# Patient Record
Sex: Female | Born: 1955 | Race: White | Hispanic: No | Marital: Married | Smoking: Never smoker
Health system: Southern US, Community
[De-identification: ages and names within clinical notes are randomized; demographics above are authoritative.]

## PROBLEM LIST (undated history)

## (undated) DIAGNOSIS — F419 Anxiety disorder, unspecified: Secondary | ICD-10-CM

## (undated) DIAGNOSIS — I10 Essential (primary) hypertension: Secondary | ICD-10-CM

## (undated) DIAGNOSIS — M1990 Unspecified osteoarthritis, unspecified site: Secondary | ICD-10-CM

## (undated) DIAGNOSIS — H269 Unspecified cataract: Secondary | ICD-10-CM

## (undated) DIAGNOSIS — I341 Nonrheumatic mitral (valve) prolapse: Secondary | ICD-10-CM

## (undated) DIAGNOSIS — I519 Heart disease, unspecified: Secondary | ICD-10-CM

## (undated) DIAGNOSIS — G4730 Sleep apnea, unspecified: Secondary | ICD-10-CM

## (undated) DIAGNOSIS — T7840XA Allergy, unspecified, initial encounter: Secondary | ICD-10-CM

## (undated) DIAGNOSIS — R413 Other amnesia: Secondary | ICD-10-CM

## (undated) DIAGNOSIS — J45909 Unspecified asthma, uncomplicated: Secondary | ICD-10-CM

## (undated) HISTORY — PX: BREAST BIOPSY: SHX20

## (undated) HISTORY — PX: TONSILLECTOMY AND ADENOIDECTOMY: SHX28

## (undated) HISTORY — PX: COLONOSCOPY: SHX174

## (undated) HISTORY — DX: Anxiety disorder, unspecified: F41.9

## (undated) HISTORY — DX: Unspecified osteoarthritis, unspecified site: M19.90

## (undated) HISTORY — DX: Sleep apnea, unspecified: G47.30

## (undated) HISTORY — DX: Heart disease, unspecified: I51.9

## (undated) HISTORY — DX: Allergy, unspecified, initial encounter: T78.40XA

## (undated) HISTORY — DX: Unspecified cataract: H26.9

## (undated) HISTORY — DX: Other amnesia: R41.3

## (undated) HISTORY — DX: Unspecified asthma, uncomplicated: J45.909

## (undated) HISTORY — DX: Essential (primary) hypertension: I10

## (undated) HISTORY — PX: EYE SURGERY: SHX253

## (undated) HISTORY — PX: CATARACT EXTRACTION: SUR2

---

## 1999-11-04 HISTORY — PX: BREAST SURGERY: SHX581

## 2010-03-22 DIAGNOSIS — I10 Essential (primary) hypertension: Secondary | ICD-10-CM | POA: Insufficient documentation

## 2010-03-26 DIAGNOSIS — N951 Menopausal and female climacteric states: Secondary | ICD-10-CM | POA: Insufficient documentation

## 2014-07-24 ENCOUNTER — Encounter: Payer: Self-pay | Admitting: Emergency Medicine

## 2014-07-24 ENCOUNTER — Emergency Department
Admission: EM | Admit: 2014-07-24 | Discharge: 2014-07-24 | Disposition: A | Discharge status: Home / Self Care | Payer: 59 | Source: Home / Self Care | Attending: Emergency Medicine | Admitting: Emergency Medicine

## 2014-07-24 DIAGNOSIS — L247 Irritant contact dermatitis due to plants, except food: Secondary | ICD-10-CM

## 2014-07-24 DIAGNOSIS — L255 Unspecified contact dermatitis due to plants, except food: Secondary | ICD-10-CM

## 2014-07-24 HISTORY — DX: Essential (primary) hypertension: I10

## 2014-07-24 HISTORY — DX: Nonrheumatic mitral (valve) prolapse: I34.1

## 2014-07-24 MED ORDER — PREDNISONE 20 MG PO TABS: 20.0000 mg | ORAL_TABLET | Freq: Two times a day (BID) | ORAL | Status: DC

## 2014-07-24 MED ORDER — METHYLPREDNISOLONE ACETATE 80 MG/ML IJ SUSP
80.0000 mg | Freq: Once | INTRAMUSCULAR | Status: AC
  Administered 2014-07-24: 80 mg via INTRAMUSCULAR

## 2014-07-24 MED ORDER — HYDROXYZINE HCL 10 MG PO TABS: 10.0000 mg | ORAL_TABLET | Freq: Four times a day (QID) | ORAL | Status: DC | PRN

## 2014-07-24 NOTE — ED Provider Notes (Signed)
CSN: [id number]     Arrival date & time 07/24/14  1403 History   First MD Initiated Contact with Patient 07/24/14 1408     Chief Complaint  Patient presents with  . Rash   (Consider location/radiation/quality/duration/timing/severity/associated sxs/prior Treatment) HPI Here with husband. They are from [place]. They're visiting family in [place]. Exposed to poison ivy 4 days ago, now severe pruritic spreading rash on face neck trunk and extremities. No lip swelling or dysphasia or breathing problems. No chest pain or wheezing or shortness of breath  No fever or chills No abdominal pain or nausea or vomiting No syncope or lightheadedness or focal neurologic symptoms   Past Medical History  Diagnosis Date  . Hypertension   . MVP (mitral valve prolapse)    Past Surgical History  Procedure Laterality Date  . Cesarean section     Family History  Problem Relation Age of Onset  . Cancer Mother   . Hypertension Mother   . Hypertension Father    History  Substance Use Topics  . Smoking status: Never Smoker   . Smokeless tobacco: Not on file  . Alcohol Use: Yes   OB History   Grav Para Term Preterm Abortions TAB SAB Ect Mult Living                 Review of Systems  All other systems reviewed and are negative.   Allergies  Review of patient's allergies indicates no known allergies.  Home Medications   Prior to Admission medications   Medication Sig Start Date End Date Taking? Authorizing Provider  calcium carbonate (OS-CAL) 600 MG TABS tablet Take 600 mg by mouth 2 (two) times daily with a meal.   Yes Historical Provider, MD  cholecalciferol (VITAMIN D) 1000 UNITS tablet Take 1,200 Units by mouth daily.   Yes Historical Provider, MD  metoprolol succinate (TOPROL-XL) 25 MG 24 hr tablet Take 25 mg by mouth daily.   Yes Historical Provider, MD  valsartan (DIOVAN) 80 MG tablet Take 80 mg by mouth daily.   Yes Historical Provider, MD  venlafaxine (EFFEXOR) 75 MG tablet  Take 75 mg by mouth 2 (two) times daily.   Yes Historical Provider, MD  hydrOXYzine (ATARAX/VISTARIL) 10 MG tablet Take 1 tablet (10 mg total) by mouth every 6 (six) hours as needed for itching. Take 2 at bedtime, for itch. Caution: May cause drowsiness 07/24/14   Lajean Manes, MD  predniSONE (DELTASONE) 20 MG tablet Take 1 tablet (20 mg total) by mouth 2 (two) times daily with a meal. X 7 days 07/24/14   Lajean Manes, MD   BP 119/64  Pulse 76  Temp(Src) 97.4 F (36.3 C) (Oral)  Resp 16  Ht  (1.676 m)  Wt 224 lb (101.606 kg)  BMI 36.17 kg/m2  SpO2 96% Physical Exam  Nursing note and vitals reviewed. Constitutional: She is oriented to person, place, and time. She appears well-developed and well-nourished. No distress.  HENT:  Head: Normocephalic and atraumatic.  Eyes: Conjunctivae and EOM are normal. Pupils are equal, round, and reactive to light. No scleral icterus.  Neck: Normal range of motion.  Cardiovascular: Normal rate.   Pulmonary/Chest: Effort normal.  Abdominal: She exhibits no distension.  Musculoskeletal: Normal range of motion.  Neurological: She is alert and oriented to person, place, and time.  Skin: Skin is warm.  Psychiatric: She has a normal mood and affect.   Skin: Severe red, maculopapular eruption, few with vesicles, some in clusters and streaks, on  face, neck, trunk and extremities. Bilateral. ED Course  Procedures (including critical care time) Labs Review Labs Reviewed - No data to display  Imaging Review No results found.   MDM   1. Contact dermatitis and eczema due to plant     Severe poison ivy contact dermatitis of face, trunk, and upper extremities. Treatment options discussed, as well as risks, benefits, alternatives. Patient voiced understanding and agreement with the following plans: Depo-Medrol 80 mg IM Prednisone burst 20 mg by mouth twice a day Hydroxyzine for itch Other symptomatic care discussed Follow-up with your primary care  doctor in 5-7 days if not improving, or sooner if symptoms become worse. Precautions discussed. Red flags discussed. Questions invited and answered. Patient and husband voiced understanding and agreement.   Lajean Manes, MD 07/24/14 [phone]

## 2014-07-24 NOTE — ED Notes (Signed)
Reports spreading of rash x 4 days; face, arms and abdomen. Did do yard work last week prior to rash.

## 2015-11-21 ENCOUNTER — Ambulatory Visit (INDEPENDENT_AMBULATORY_CARE_PROVIDER_SITE_OTHER): Payer: 59 | Admitting: Osteopathic Medicine

## 2015-11-21 ENCOUNTER — Encounter: Payer: Self-pay | Admitting: Osteopathic Medicine

## 2015-11-21 VITALS — BP 128/73 | HR 73 | Ht 66.0 in | Wt 209.0 lb

## 2015-11-21 DIAGNOSIS — H6122 Impacted cerumen, left ear: Secondary | ICD-10-CM

## 2015-11-21 DIAGNOSIS — Z1239 Encounter for other screening for malignant neoplasm of breast: Secondary | ICD-10-CM

## 2015-11-21 DIAGNOSIS — R059 Cough, unspecified: Secondary | ICD-10-CM

## 2015-11-21 DIAGNOSIS — E785 Hyperlipidemia, unspecified: Secondary | ICD-10-CM | POA: Diagnosis not present

## 2015-11-21 DIAGNOSIS — Z1159 Encounter for screening for other viral diseases: Secondary | ICD-10-CM | POA: Diagnosis not present

## 2015-11-21 DIAGNOSIS — Z79899 Other long term (current) drug therapy: Secondary | ICD-10-CM | POA: Diagnosis not present

## 2015-11-21 DIAGNOSIS — Z780 Asymptomatic menopausal state: Secondary | ICD-10-CM | POA: Insufficient documentation

## 2015-11-21 DIAGNOSIS — I10 Essential (primary) hypertension: Secondary | ICD-10-CM

## 2015-11-21 DIAGNOSIS — R05 Cough: Secondary | ICD-10-CM

## 2015-11-21 HISTORY — DX: Essential (primary) hypertension: I10

## 2015-11-21 MED ORDER — BENZONATATE 200 MG PO CAPS: 200.0000 mg | ORAL_CAPSULE | Freq: Three times a day (TID) | ORAL | Status: DC | PRN

## 2015-11-21 MED ORDER — IPRATROPIUM-ALBUTEROL 0.5-2.5 (3) MG/3ML IN SOLN
3.0000 mL | Freq: Once | RESPIRATORY_TRACT | Status: AC
  Administered 2015-11-21: 3 mL via RESPIRATORY_TRACT

## 2015-11-21 NOTE — Progress Notes (Signed)
HPI: Pamela Newman is a 60 y.o. female who presents to [facility] today for chief complaint of:  Chief Complaint  Patient presents with  . Establish Care    CHEST COLD    Chest Cold/Cough . Location: chest  . Quality: cough/congestion  . Duration: 3 weeks . Context: had bit of a cold, getting better overall but trouble shaking this cough  "Due for labs and other things" including Pap, mammo, preventive care. Current on colonoscopy.     Past medical, social and family history reviewed: Past Medical History  Diagnosis Date  . Hypertension   . MVP (mitral valve prolapse)   . Heart disease     SMALL PROLAPSE   Past Surgical History  Procedure Laterality Date  . Cesarean section    . Breast surgery  2001    BREAST CYST   Social History  Substance Use Topics  . Smoking status: Never Smoker   . Smokeless tobacco: Not on file  . Alcohol Use: Yes   Family History  Problem Relation Age of Onset  . Cancer Mother     BREAST  . Hypertension Mother   . Hyperlipidemia Mother   . Hypertension Father   . Hyperlipidemia Father   . Stroke Father   . Cancer Sister     LUNG  . Cancer Maternal Grandfather     COLON    Current Outpatient Prescriptions  Medication Sig Dispense Refill  . calcium carbonate (OS-CAL) 600 MG TABS tablet Take 600 mg by mouth 2 (two) times daily with a meal.    . metoprolol succinate (TOPROL-XL) 25 MG 24 hr tablet Take 25 mg by mouth daily.    . valsartan (DIOVAN) 80 MG tablet Take 80 mg by mouth daily.    Marland Kitchen venlafaxine (EFFEXOR) 75 MG tablet Take 75 mg by mouth 2 (two) times daily.    . cholecalciferol (VITAMIN D) 1000 UNITS tablet Take 1,200 Units by mouth daily.    . hydrOXYzine (ATARAX/VISTARIL) 10 MG tablet Take 1 tablet (10 mg total) by mouth every 6 (six) hours as needed for itching. Take 2 at bedtime, for itch. Caution: May cause drowsiness 21 tablet 0  . predniSONE (DELTASONE) 20 MG tablet Take 1 tablet (20 mg  total) by mouth 2 (two) times daily with a meal. X 7 days (Patient not taking: Reported on 11/21/2015) 14 tablet 0   No current facility-administered medications for this visit.   No Known Allergies    Review of Systems: CONSTITUTIONAL:  No  fever, no chills, No  unintentional weight changes HEAD/EYES/EARS/NOSE/THROAT: No  headache, no vision change, no hearing change, No  sore throat, No  sinus pressure CARDIAC: No  chest pain, No  pressure, No palpitations, No  orthopnea RESPIRATORY: (+) cough, No  shortness of breath/wheeze GASTROINTESTINAL: No  nausea, No  vomiting, No  abdominal pain, No  blood in stool, No  diarrhea, No  constipation  MUSCULOSKELETAL: No  myalgia/arthralgia GENITOURINARY: No  incontinence, No  abnormal genital bleeding/discharge SKIN: No  rash/wounds/concerning lesions HEM/ONC: No  easy bruising/bleeding, No  abnormal lymph node ENDOCRINE: No polyuria/polydipsia/polyphagia, No  heat/cold intolerance  NEUROLOGIC: No  weakness, No  dizziness, No  slurred speech PSYCHIATRIC: No  concerns with depression, No  concerns with anxiety, No sleep problems  Exam:  BP 128/73 mmHg  Pulse 73  Ht  (1.676 m)  Wt 209 lb (94.802 kg)  BMI 33.75 kg/m2 Constitutional: VS see above. General Appearance: alert, well-developed, well-nourished,  NAD Eyes: Normal lids and conjunctive, non-icteric sclera Ears, Nose, Mouth, Throat: MMM, Normal external inspection ears/nares/mouth/lips/gums, TM normal R but obscured by cerumen on L. Pharynx no erythema, no exudate.  Neck: No masses, trachea midline. No thyroid enlargement/tenderness/mass appreciated. No lymphadenopathy Respiratory: Normal respiratory effort. no wheeze, no rhonchi, no rales Cardiovascular: S1/S2 normal, no murmur, no rub/gallop auscultated. RRR. No lower extremity edema. Gastrointestinal: Nontender, no masses. No hepatomegaly, no splenomegaly. No hernia appreciated. Bowel sounds normal. Rectal exam deferred.   Musculoskeletal: Gait normal. No clubbing/cyanosis of digits.  Neurological: No cranial nerve deficit on limited exam. Motor and sensation intact and symmetric Skin: warm, dry, intact. No rash/ulcer. No concerning nevi or subq nodules on limited exam.   Psychiatric: Normal judgment/insight. Normal mood and affect. Oriented x3.     ASSESSMENT/PLAN:  Essential hypertension - Plan: CBC with Differential/Platelet, COMPLETE METABOLIC PANEL WITH GFR, Lipid panel, TSH  Hyperlipidemia - Plan: Lipid panel  Need for hepatitis C screening test - Plan: Hepatitis C antibody  Medication management - Plan: CBC with Differential/Platelet, COMPLETE METABOLIC PANEL WITH GFR  Postmenopause - Plan: VITAMIN D 25 Hydroxy (Vit-D Deficiency, Fractures)  Screening for breast cancer - Plan: MS DIGITAL SCREENING BILATERAL  Cough - Plan: benzonatate (TESSALON) 200 MG capsule, ipratropium-albuterol (DUONEB) 0.5-2.5 (3) MG/3ML nebulizer solution 3 mL   Return AT YOUR CONVENIENCE FOR ANNUAL WELLNESS EXAM AND PAP.

## 2016-02-01 ENCOUNTER — Other Ambulatory Visit: Payer: Self-pay | Admitting: Osteopathic Medicine

## 2016-03-03 ENCOUNTER — Other Ambulatory Visit: Payer: Self-pay | Admitting: Osteopathic Medicine

## 2016-03-05 ENCOUNTER — Ambulatory Visit (INDEPENDENT_AMBULATORY_CARE_PROVIDER_SITE_OTHER): Payer: 59

## 2016-03-05 ENCOUNTER — Ambulatory Visit: Payer: 59

## 2016-03-05 DIAGNOSIS — Z1231 Encounter for screening mammogram for malignant neoplasm of breast: Secondary | ICD-10-CM

## 2016-03-05 DIAGNOSIS — Z1239 Encounter for other screening for malignant neoplasm of breast: Secondary | ICD-10-CM

## 2016-03-26 ENCOUNTER — Encounter: Payer: 59 | Admitting: Osteopathic Medicine

## 2016-03-29 ENCOUNTER — Other Ambulatory Visit: Payer: Self-pay | Admitting: Osteopathic Medicine

## 2016-04-16 ENCOUNTER — Encounter: Payer: 59 | Admitting: Osteopathic Medicine

## 2016-05-01 ENCOUNTER — Other Ambulatory Visit: Payer: Self-pay | Admitting: Osteopathic Medicine

## 2016-05-02 ENCOUNTER — Other Ambulatory Visit: Payer: Self-pay | Admitting: Osteopathic Medicine

## 2016-05-02 LAB — CBC WITH DIFFERENTIAL/PLATELET
BASOS ABS: 47 {cells}/uL (ref 0–200)
Basophils Relative: 1 %
EOS PCT: 2 %
Eosinophils Absolute: 94 cells/uL (ref 15–500)
HCT: 39.8 % (ref 35.0–45.0)
Hemoglobin: 13.3 g/dL (ref 11.7–15.5)
LYMPHS PCT: 28 %
Lymphs Abs: 1316 cells/uL (ref 850–3900)
MCH: 29.6 pg (ref 27.0–33.0)
MCHC: 33.4 g/dL (ref 32.0–36.0)
MCV: 88.4 fL (ref 80.0–100.0)
MONOS PCT: 7 %
MPV: 10.4 fL (ref 7.5–12.5)
Monocytes Absolute: 329 cells/uL (ref 200–950)
NEUTROS PCT: 62 %
Neutro Abs: 2914 cells/uL (ref 1500–7800)
PLATELETS: 186 10*3/uL (ref 140–400)
RBC: 4.5 MIL/uL (ref 3.80–5.10)
RDW: 14.9 % (ref 11.0–15.0)
WBC: 4.7 10*3/uL (ref 3.8–10.8)

## 2016-05-03 LAB — COMPLETE METABOLIC PANEL WITH GFR
ALK PHOS: 56 U/L (ref 33–130)
ALT: 27 U/L (ref 6–29)
AST: 23 U/L (ref 10–35)
Albumin: 4.2 g/dL (ref 3.6–5.1)
BUN: 18 mg/dL (ref 7–25)
CHLORIDE: 104 mmol/L (ref 98–110)
CO2: 25 mmol/L (ref 20–31)
Calcium: 9.2 mg/dL (ref 8.6–10.4)
Creat: 1.05 mg/dL — ABNORMAL HIGH (ref 0.50–0.99)
GFR, EST NON AFRICAN AMERICAN: 57 mL/min — AB (ref >=60)
GFR, Est African American: 66 mL/min (ref >=60)
GLUCOSE: 94 mg/dL (ref 65–99)
Potassium: 4.4 mmol/L (ref 3.5–5.3)
SODIUM: 139 mmol/L (ref 135–146)
Total Bilirubin: 0.6 mg/dL (ref 0.2–1.2)
Total Protein: 6.7 g/dL (ref 6.1–8.1)

## 2016-05-03 LAB — LIPID PANEL
CHOL/HDL RATIO: 5.1 ratio — AB (ref <=5.0)
Cholesterol: 190 mg/dL (ref 125–200)
HDL: 37 mg/dL — AB (ref >=46)
LDL Cholesterol: 118 mg/dL (ref <130)
Triglycerides: 177 mg/dL — ABNORMAL HIGH (ref <150)
VLDL: 35 mg/dL — AB (ref <30)

## 2016-05-03 LAB — HEPATITIS C ANTIBODY: HCV Ab: NEGATIVE

## 2016-05-03 LAB — TSH: TSH: 1.87 m[IU]/L

## 2016-05-03 LAB — VITAMIN D 25 HYDROXY (VIT D DEFICIENCY, FRACTURES): VIT D 25 HYDROXY: 25 ng/mL — AB (ref 30–100)

## 2016-05-07 ENCOUNTER — Encounter: Payer: 59 | Admitting: Osteopathic Medicine

## 2016-05-28 ENCOUNTER — Other Ambulatory Visit (HOSPITAL_COMMUNITY)
Admission: RE | Admit: 2016-05-28 | Discharge: 2016-05-28 | Disposition: A | Discharge status: Home / Self Care | Payer: 59 | Source: Ambulatory Visit | Attending: Osteopathic Medicine | Admitting: Osteopathic Medicine

## 2016-05-28 ENCOUNTER — Ambulatory Visit (INDEPENDENT_AMBULATORY_CARE_PROVIDER_SITE_OTHER): Payer: 59 | Admitting: Osteopathic Medicine

## 2016-05-28 ENCOUNTER — Encounter: Payer: Self-pay | Admitting: Osteopathic Medicine

## 2016-05-28 VITALS — BP 120/70 | HR 85 | Ht 66.0 in | Wt 217.0 lb

## 2016-05-28 DIAGNOSIS — Z0000 Encounter for general adult medical examination without abnormal findings: Secondary | ICD-10-CM

## 2016-05-28 DIAGNOSIS — J302 Other seasonal allergic rhinitis: Secondary | ICD-10-CM

## 2016-05-28 DIAGNOSIS — Z1151 Encounter for screening for human papillomavirus (HPV): Secondary | ICD-10-CM | POA: Insufficient documentation

## 2016-05-28 DIAGNOSIS — R748 Abnormal levels of other serum enzymes: Secondary | ICD-10-CM

## 2016-05-28 DIAGNOSIS — Z01419 Encounter for gynecological examination (general) (routine) without abnormal findings: Secondary | ICD-10-CM | POA: Insufficient documentation

## 2016-05-28 DIAGNOSIS — R7989 Other specified abnormal findings of blood chemistry: Secondary | ICD-10-CM | POA: Insufficient documentation

## 2016-05-28 NOTE — Progress Notes (Signed)
HPI: Pamela Newman is a 60 y.o. Not Hispanic or Latino female  who presents to [facility] today, 05/28/16,  for chief complaint of:  Chief Complaint  Patient presents with  . Annual Exam  . Gynecologic Exam     See below for preventive care review. Patient's only complaint today is persistent allergy issues, sinus as well as some skin irritation.  Past medical, surgical, social and family history reviewed: Past Medical History:  Diagnosis Date  . Essential hypertension 11/21/2015  . Heart disease    SMALL PROLAPSE  . Hypertension   . MVP (mitral valve prolapse)    Past Surgical History:  Procedure Laterality Date  . BREAST SURGERY  2001   BREAST CYST  . CESAREAN SECTION     Social History  Substance Use Topics  . Smoking status: Never Smoker  . Smokeless tobacco: Not on file  . Alcohol use Yes   Family History  Problem Relation Age of Onset  . Cancer Mother     BREAST  . Hypertension Mother   . Hyperlipidemia Mother   . Hypertension Father   . Hyperlipidemia Father   . Stroke Father   . Cancer Sister     LUNG  . Cancer Maternal Grandfather     COLON     Current medication list and allergy/intolerance information reviewed:   Current Outpatient Prescriptions  Medication Sig Dispense Refill  . calcium carbonate (OS-CAL) 600 MG TABS tablet Take 600 mg by mouth 2 (two) times daily with a meal.    . FLUARIX QUADRIVALENT 0.5 ML injection TO BE ADMINISTERED BY PHARMACIST FOR IMMUNIZATION  0  . metoprolol succinate (TOPROL-XL) 25 MG 24 hr tablet Take 25 mg by mouth daily.    . valsartan (DIOVAN) 80 MG tablet Take 1 tablet (80 mg total) by mouth daily. 30 tablet 0  . venlafaxine XR (EFFEXOR-XR) 75 MG 24 hr capsule Take 75 mg by mouth daily with breakfast.     No current facility-administered medications for this visit.    No Known Allergies    Review of Systems:  Constitutional:  No  fever, no chills, No recent illness, No  unintentional weight changes. No significant fatigue.   HEENT: No  headache, no vision change, no hearing change, No sore throat, (+) sinus pressure  Cardiac: No  chest pain  Respiratory:  No  shortness of breath.  Gastrointestinal: No  abdominal pain   Musculoskeletal: No new myalgia/arthralgia  Genitourinary: No  incontinence, No  abnormal genital bleeding, No abnormal genital discharge  Skin: (+) allergic Rash, No other wounds/concerning lesions  Neurologic: No  weakness, No  dizziness  Psychiatric: No  concerns with depression, No  concerns with anxiety, No sleep problems, No mood problems  Exam:  BP 120/70   Pulse 85   Ht  (1.676 m)   Wt 217 lb (98.4 kg)   BMI 35.02 kg/m   Constitutional: VS see above. General Appearance: alert, well-developed, well-nourished, NAD  Ears, Nose, Mouth, Throat: MMM, Normal external inspection ears/nares/mouth/lips/gums.   Respiratory: Normal respiratory effort.   Cardiovascular: S1/S2 normal, no murmur, no rub/gallop auscultated. RRR.  Gastrointestinal: Nontender, no masses. No hepatomegaly, no splenomegaly. No hernia appreciated. Bowel sounds normal. Rectal exam deferred.   Musculoskeletal: Gait normal. No clubbing/cyanosis of digits.   Neurological: No cranial nerve deficit on limited exam. Motor and sensation intact and symmetric  Skin: warm, dry, intact. (+) urticarial diffuse rash, no ulcer. No concerning nevi or subq nodules  on limited exam.    Psychiatric: Normal judgment/insight. Normal mood and affect. Oriented x3.  GYN: No lesions/ulcers to external genitalia, normal urethra, normal vaginal mucosa, physiologic discharge, cervix normal without lesions, uterus not enlarged or tender, adnexa no masses and nontender  BREAST: No rashes/skin changes, normal fibrous breast tissue, no masses or tenderness, normal nipple without discharge, normal axilla    ASSESSMENT/PLAN:   Patient is going to call previous doctor's  office to confirm immunizations and previous colonoscopy report. May need to update tetanus/shingles  Continue current medications, ASCVD risk less than 7.5% based on current risk factors. Only need to repeat BMP for kidney function monitoring  Annual physical exam - Plan: Cytology - PAP  Seasonal allergies  Elevated serum creatinine - Plan: BASIC METABOLIC PANEL WITH GFR    FEMALE PREVENTIVE CARE  ANNUAL SCREENING/COUNSELING Tobacco - noNever  Alcohol - social drinker Diet/Exercise - HEALTHY HABITS DISCUSSED TO DECREASE CV RISK Depression - PQH2 Negative Domestic violence concerns - no HTN SCREENING - SEE VITALS Vaccination status - SEE BELOW  SEXUAL HEALTH Sexually active in the past year - yes With - Yes with female. STI - The patient denies history of sexually transmitted disease. STI testing today? - no  INFECTIOUS DISEASE SCREENING HIV - all adults 15-65 - does not need GC/CT - sexually active - does not need HepC - DOB [birth year] - does not need TB - does not need  DISEASE SCREENING Lipid - does not need DM2 - does not need Osteoporosis - does not need  CANCER SCREENING Cervical - needs Breast - does not need Lung - does not need Colon - does not need - had 2 years ago - Fall 2015 (+) Polys, f/u 3 years   ADULT VACCINATION Influenza - was not indicated Td - was not indicated HPV - was not indicated Zoster - was not indicated Pneumonia - was not indicated  OTHER Fall - exercise and Vit D age 46+ - does not need Consider ASA - age 84-59 - does not need but could consdier    Visit summary with medication list and pertinent instructions was printed for patient to review. All questions at time of visit were answered - patient instructed to contact office with any additional concerns. ER/RTC precautions were reviewed with the patient. Follow-up plan: Return in about 6 months (around 11/28/2016) for BLOOD PRESSURE AND VENLAXAFINE FOLLOW-UP.

## 2016-05-28 NOTE — Patient Instructions (Addendum)
Please call your previous doctors office to confirm immunizations: Tetanus/Tdap and Shingles. Having old colonoscopy reports will also be helpful.   For allergies: try Allegra/Fexofenadine, can use with Nasonex or Flonase nasal spray, can use Allegra-D w/ decongestant. If this isn't helping your symptoms, please let me know!   To monitor kidney function: sometime in the next 1 - 2 months we will have you repeat the blood work. Ideally, should be 6+ weeks after the initial blood draw, which was 05/02/16, so anytime after 06/13/16 should be fine.     Let's plan to follow up in 6 months to keep an eye on your blood pressure - we can also talk more about coming off the Venlaxafine if you'd like.

## 2016-05-29 ENCOUNTER — Other Ambulatory Visit: Payer: Self-pay | Admitting: Osteopathic Medicine

## 2016-05-30 LAB — CYTOLOGY - PAP

## 2016-06-06 ENCOUNTER — Other Ambulatory Visit: Payer: Self-pay | Admitting: Osteopathic Medicine

## 2016-07-03 ENCOUNTER — Other Ambulatory Visit: Payer: Self-pay | Admitting: Osteopathic Medicine

## 2016-07-28 ENCOUNTER — Other Ambulatory Visit: Payer: Self-pay | Admitting: Osteopathic Medicine

## 2016-08-30 ENCOUNTER — Other Ambulatory Visit: Payer: Self-pay | Admitting: Osteopathic Medicine

## 2016-09-12 LAB — BASIC METABOLIC PANEL WITH GFR
BUN: 11 mg/dL (ref 7–25)
CALCIUM: 9.6 mg/dL (ref 8.6–10.4)
CO2: 27 mmol/L (ref 20–31)
CREATININE: 1.06 mg/dL — AB (ref 0.50–0.99)
Chloride: 102 mmol/L (ref 98–110)
GFR, EST AFRICAN AMERICAN: 65 mL/min (ref >=60)
GFR, EST NON AFRICAN AMERICAN: 56 mL/min — AB (ref >=60)
GLUCOSE: 108 mg/dL — AB (ref 65–99)
Potassium: 4.4 mmol/L (ref 3.5–5.3)
Sodium: 141 mmol/L (ref 135–146)

## 2016-09-27 ENCOUNTER — Other Ambulatory Visit: Payer: Self-pay | Admitting: Osteopathic Medicine

## 2016-10-29 ENCOUNTER — Other Ambulatory Visit: Payer: Self-pay | Admitting: Osteopathic Medicine

## 2016-12-01 ENCOUNTER — Other Ambulatory Visit: Payer: Self-pay | Admitting: Osteopathic Medicine

## 2016-12-25 ENCOUNTER — Encounter: Payer: Self-pay | Admitting: Osteopathic Medicine

## 2016-12-25 ENCOUNTER — Ambulatory Visit (INDEPENDENT_AMBULATORY_CARE_PROVIDER_SITE_OTHER): Payer: 59 | Admitting: Osteopathic Medicine

## 2016-12-25 VITALS — BP 127/63 | HR 79 | Temp 98.1°F | Ht 66.0 in | Wt 248.0 lb

## 2016-12-25 DIAGNOSIS — Z0189 Encounter for other specified special examinations: Secondary | ICD-10-CM | POA: Diagnosis not present

## 2016-12-25 DIAGNOSIS — G9331 Postviral fatigue syndrome: Secondary | ICD-10-CM

## 2016-12-25 DIAGNOSIS — G933 Postviral fatigue syndrome: Secondary | ICD-10-CM

## 2016-12-25 LAB — COMPLETE METABOLIC PANEL WITH GFR
ALT: 32 U/L — ABNORMAL HIGH (ref 6–29)
AST: 27 U/L (ref 10–35)
Albumin: 4.3 g/dL (ref 3.6–5.1)
Alkaline Phosphatase: 56 U/L (ref 33–130)
BILIRUBIN TOTAL: 0.3 mg/dL (ref 0.2–1.2)
BUN: 19 mg/dL (ref 7–25)
CO2: 24 mmol/L (ref 20–31)
CREATININE: 1.06 mg/dL — AB (ref 0.50–0.99)
Calcium: 9.3 mg/dL (ref 8.6–10.4)
Chloride: 108 mmol/L (ref 98–110)
GFR, EST AFRICAN AMERICAN: 65 mL/min (ref >=60)
GFR, EST NON AFRICAN AMERICAN: 56 mL/min — AB (ref >=60)
Glucose, Bld: 85 mg/dL (ref 65–99)
Potassium: 4.5 mmol/L (ref 3.5–5.3)
Sodium: 142 mmol/L (ref 135–146)
TOTAL PROTEIN: 6.9 g/dL (ref 6.1–8.1)

## 2016-12-25 LAB — CBC WITH DIFFERENTIAL/PLATELET
BASOS ABS: 48 {cells}/uL (ref 0–200)
BASOS PCT: 1 %
Eosinophils Absolute: 240 cells/uL (ref 15–500)
Eosinophils Relative: 5 %
HCT: 39.9 % (ref 35.0–45.0)
HEMOGLOBIN: 13.2 g/dL (ref 11.7–15.5)
Lymphocytes Relative: 28 %
Lymphs Abs: 1344 cells/uL (ref 850–3900)
MCH: 29.4 pg (ref 27.0–33.0)
MCHC: 33.1 g/dL (ref 32.0–36.0)
MCV: 88.9 fL (ref 80.0–100.0)
MONO ABS: 480 {cells}/uL (ref 200–950)
MPV: 10.7 fL (ref 7.5–12.5)
Monocytes Relative: 10 %
Neutro Abs: 2688 cells/uL (ref 1500–7800)
Neutrophils Relative %: 56 %
Platelets: 215 10*3/uL (ref 140–400)
RBC: 4.49 MIL/uL (ref 3.80–5.10)
RDW: 15.1 % — ABNORMAL HIGH (ref 11.0–15.0)
WBC: 4.8 10*3/uL (ref 3.8–10.8)

## 2016-12-25 MED ORDER — METOPROLOL SUCCINATE ER 25 MG PO TB24: 25.0000 mg | ORAL_TABLET | Freq: Every day | ORAL | 1 refills | Status: DC

## 2016-12-25 MED ORDER — VALSARTAN 80 MG PO TABS: 80.0000 mg | ORAL_TABLET | Freq: Every day | ORAL | 1 refills | Status: DC

## 2016-12-25 MED ORDER — VENLAFAXINE HCL ER 75 MG PO CP24: 75.0000 mg | ORAL_CAPSULE | Freq: Every day | ORAL | 1 refills | Status: DC

## 2016-12-25 NOTE — Progress Notes (Signed)
HPI: Pamela Newman is a 61 y.o. female who [facility] for chief complaint of:  Chief Complaint  Patient presents with  . Cough  . Medication Refill    Acute Illness: . Context/Quality: recent illness, on and off feeling fatigue and drained, some coughing, thinks family may have had flu . Duration: on and off 3 weeks    Past medical, social and family history reviewed. Immune compromising conditions or other risk factors: none  Current medications and allergies reviewed.     Review of Systems:  Constitutional: subjective fever/chills  HEENT: No  headache, No  sore throat, No  swollen glands  Cardiovascular: No chest pain  Respiratory:Yes  cough, No  shortness of breath  Gastrointestinal: No  nausea, No  vomiting,  No  diarrhea  Musculoskeletal:   Yes  myalgia/arthralgia  Skin/Integument:  No  rash   Detailed Exam:  BP 127/63   Pulse 79   Temp 98.1 F (36.7 C) (Oral)   Ht 5\' 6"  (1.676 m)   Wt 248 lb (112.5 kg)   BMI 40.03 kg/m   Constitutional:   VSS, see above.   General Appearance: alert, well-developed, well-nourished, NAD  Eyes:   Normal lids and conjunctive, non-icteric sclera  Ears, Nose, Mouth, Throat:   Normal external inspection ears/nares  Normal mouth/lips/gums, MMM  normal TM  posterior pharynx without erythema, without exudate  nasal mucosa normal  Skin:  Normal inspection, no rash or concerning lesions noted on limited exam  Neck:   No masses, trachea midline. normal lymph nodes  Respiratory:   Normal respiratory effort.   No  wheeze/rhonchi/rales  Cardiovascular:   S1/S2 normal, no murmur/rub/gallop auscultated. RRR.     ASSESSMENT/PLAN: Likely viral fatigue syndrome, pt reassured nothing serious  Postviral fatigue syndrome - Plan: CBC with Differential/Platelet, COMPLETE METABOLIC PANEL WITH GFR, VITAMIN D 25 Hydroxy (Vit-D Deficiency, Fractures)  Patient  request for diagnostic testing - Plan: POCT Influenza A/B   Visit summary was printed for the patient with medications and pertinent instructions for patient to review. ER/RTC precautions reviewed. All questions answered. Return in about 2 weeks (around 01/08/2017) for DISCUSS EFFEXOR, ETC, sooner if needed.   There are other unrelated non-urgent complaints, but due to the busy schedule and the amount of time I've already spent with her, time does not permit me to address these routine issues at today's visit. I've requested another appointment to review these additional issues: depression, HTN - refills sent

## 2016-12-26 LAB — VITAMIN D 25 HYDROXY (VIT D DEFICIENCY, FRACTURES): Vit D, 25-Hydroxy: 27 ng/mL — ABNORMAL LOW (ref 30–100)

## 2017-01-13 ENCOUNTER — Ambulatory Visit: Payer: 59 | Admitting: Osteopathic Medicine

## 2017-01-26 ENCOUNTER — Ambulatory Visit: Payer: 59 | Admitting: Osteopathic Medicine

## 2017-03-24 ENCOUNTER — Ambulatory Visit (INDEPENDENT_AMBULATORY_CARE_PROVIDER_SITE_OTHER): Payer: 59

## 2017-03-24 ENCOUNTER — Ambulatory Visit (INDEPENDENT_AMBULATORY_CARE_PROVIDER_SITE_OTHER): Payer: 59 | Admitting: Osteopathic Medicine

## 2017-03-24 VITALS — BP 120/70 | HR 100 | Temp 98.2°F | Wt 240.0 lb

## 2017-03-24 DIAGNOSIS — R05 Cough: Secondary | ICD-10-CM

## 2017-03-24 DIAGNOSIS — R059 Cough, unspecified: Secondary | ICD-10-CM

## 2017-03-24 MED ORDER — AZITHROMYCIN 250 MG PO TABS: ORAL_TABLET | ORAL | 0 refills | Status: DC

## 2017-03-24 MED ORDER — GUAIFENESIN-CODEINE 100-10 MG/5ML PO SOLN: 5.0000 mL | ORAL | 0 refills | Status: DC | PRN

## 2017-03-24 MED ORDER — PREDNISONE 20 MG PO TABS: 20.0000 mg | ORAL_TABLET | Freq: Two times a day (BID) | ORAL | 0 refills | Status: DC

## 2017-03-24 NOTE — Progress Notes (Signed)
HPI: Pamela Newman is a 61 y.o. female who [facility] for chief complaint of:  Chief Complaint  Patient presents with  . Cough    Acute Illness: . Quality: dry cough . Assoc signs/symptoms: sinus congested, Temperature elevated at home up to 99 . Duration: 7 or so days, started last week and was a bit better over the weekend but none seems to be getting worse   Past medical, social and family history reviewed.  Patient Active Problem List   Diagnosis Date Noted  . Elevated serum creatinine 05/28/2016  . Essential hypertension 11/21/2015  . Hyperlipidemia 11/21/2015  . Postmenopause 11/21/2015   Current medications and allergies reviewed.     Review of Systems:  Constitutional: No  fever/chills  HEENT: sinus headache, No  sore throat, No  swollen glands  Cardiovascular: No chest pain  Respiratory:Yes  cough, No  shortness of breath  Gastrointestinal: No  nausea, No  vomiting,  No  diarrhea  Musculoskeletal:   No  myalgia/arthralgia  Skin/Integument:  No  rash   Detailed Exam:  BP 120/70   Pulse 100   Temp 98.2 F (36.8 C) (Oral)   Wt 240 lb (108.9 kg)   BMI 38.74 kg/m   Constitutional:   VSS, see above.   General Appearance: alert, well-developed, well-nourished, NAD  Eyes:   Normal lids and conjunctive, non-icteric sclera  Ears, Nose, Mouth, Throat:   Normal external inspection ears/nares  Normal mouth/lips/gums, MMM  Skin:  Normal inspection, no rash or concerning lesions noted on limited exam  Neck:   No masses, trachea midline. normal lymph nodes  Respiratory:   Normal respiratory effort.   No  wheeze/rhonchi/rales  Cardiovascular:   S1/S2 normal, no murmur/rub/gallop auscultated. RRR.  CXR personally reviewed. Radiology over-read as below No results found.   ASSESSMENT/PLAN:  Cough - Likely bronchitis vs viral URI, no concerns on CXR on personal review  - Plan: DG  Chest 2 View, predniSONE (DELTASONE) 20 MG tablet, azithromycin (ZITHROMAX) 250 MG tablet, guaiFENesin-codeine 100-10 MG/5ML syrup     Visit summary was printed for the patient with medications and pertinent instructions for patient to review. ER/RTC precautions reviewed. All questions answered. Return for ANNUAL PHYSICAL 05/2017, sooner if needed.

## 2017-03-24 NOTE — Patient Instructions (Signed)
Over-the-Counter Medications & Home Remedies for Upper Respiratory Illness  Note: the following list assumes no pregnancy, normal liver & kidney function and no other drug interactions. Dr. Corrisa Gibby has highlighted medications which are safe for you to use, but these may not be appropriate for everyone. Always ask a pharmacist or qualified medical provider if you have any questions!   Aches/Pains, Fever, Headache Acetaminophen (Tylenol) 500 mg tablets - take max 2 tablets (1000 mg) every 6 hours (4 times per day)  Ibuprofen (Motrin) 200 mg tablets - take max 4 tablets (800 mg) every 6 hours*  Sinus Congestion Prescription Atrovent as directed Cromolyn Nasal Spray (NasalCrom) 1 spray each nostril 3-4 times per day, max 6 imes per day Nasal Saline if desired Oxymetolazone (Afrin, others) sparing use due to rebound congestion, NEVER use in kids Phenylephrine (Sudafed) 10 mg tablets every 4 hours (or the 12-hour formulation)* Diphenhydramine (Benadryl) 25 mg tablets - take max 2 tablets every 4 hours  Cough & Sore Throat Prescription cough pills or syrups as directed Dextromethorphan (Robitussin, others) - cough suppressant Guaifenesin (Robitussin, Mucinex, others) - expectorant (helps cough up mucus) (Dextromethorphan and Guaifenesin also come in a combination tablet) Lozenges w/ Benzocaine + Menthol (Cepacol) Honey - as much as you want! Teas which "coat the throat" - look for ingredients Elm Bark, Licorice Root, Marshmallow Root  Other Antibiotics if these are prescribed - take ALL, even if you're feeling better  Zinc Lozenges within 24 hours of symptoms onset - mixed evidence this shortens the duration of the common cold Don't waste your money on Vitamin C or Echinacea  *Caution in patients with high blood pressure    

## 2017-04-09 ENCOUNTER — Other Ambulatory Visit: Payer: Self-pay | Admitting: Osteopathic Medicine

## 2017-06-08 ENCOUNTER — Telehealth: Payer: Self-pay | Admitting: Osteopathic Medicine

## 2017-06-08 DIAGNOSIS — Z0000 Encounter for general adult medical examination without abnormal findings: Secondary | ICD-10-CM

## 2017-06-08 NOTE — Telephone Encounter (Signed)
Patient called and request to get a mammogram order for imaging department. Has a cpe scheduled for 06/25/17 and would like to get labs done the week before. Thanks

## 2017-06-09 NOTE — Telephone Encounter (Signed)
Orders are in

## 2017-06-12 ENCOUNTER — Telehealth: Payer: Self-pay

## 2017-06-12 MED ORDER — LOSARTAN POTASSIUM 100 MG PO TABS: 100.0000 mg | ORAL_TABLET | Freq: Every day | ORAL | 0 refills | Status: DC

## 2017-06-12 NOTE — Telephone Encounter (Signed)
Dr. Lyn HollingsheadAlexander patient: Patient called in reference to Valsartan being on recall she is requesting a new blood pressure  Medication sent to her pharmacy. Please advise Estelle JuneRhonda Cunningham,CMA

## 2017-06-12 NOTE — Telephone Encounter (Signed)
Okay, new prescription sent for losartan in place of the valsartan. Recommend that she follow-up for a nurse visit in about 2-3 weeks on the new medication just so that we can make sure that it's working well. These medicines are similar but they are not exactly the same.  Pamela Gasseratherine Blessing Ozga, MD

## 2017-06-12 NOTE — Telephone Encounter (Signed)
Patient has been informed she stated that she has a follow up appointment with Dr. Lyn HollingsheadAlexander the end of this month. Rhonda Cunningham,CMA

## 2017-06-25 ENCOUNTER — Encounter: Payer: 59 | Admitting: Osteopathic Medicine

## 2017-07-08 ENCOUNTER — Ambulatory Visit: Payer: 59

## 2017-07-08 LAB — COMPLETE METABOLIC PANEL WITH GFR
ALBUMIN: 4.5 g/dL (ref 3.6–5.1)
ALT: 18 U/L (ref 6–29)
AST: 19 U/L (ref 10–35)
Alkaline Phosphatase: 59 U/L (ref 33–130)
BUN: 15 mg/dL (ref 7–25)
CALCIUM: 9.5 mg/dL (ref 8.6–10.4)
CO2: 26 mmol/L (ref 20–32)
CREATININE: 0.96 mg/dL (ref 0.50–0.99)
Chloride: 102 mmol/L (ref 98–110)
GFR, EST AFRICAN AMERICAN: 73 mL/min (ref >=60)
GFR, Est Non African American: 63 mL/min (ref >=60)
Glucose, Bld: 91 mg/dL (ref 65–99)
Potassium: 4.6 mmol/L (ref 3.5–5.3)
Sodium: 138 mmol/L (ref 135–146)
Total Bilirubin: 0.3 mg/dL (ref 0.2–1.2)
Total Protein: 7 g/dL (ref 6.1–8.1)

## 2017-07-08 LAB — TSH: TSH: 1.68 mIU/L

## 2017-07-08 LAB — LIPID PANEL
CHOL/HDL RATIO: 4.4 ratio (ref <5.0)
Cholesterol: 179 mg/dL (ref <200)
HDL: 41 mg/dL — AB (ref >50)
LDL CALC: 112 mg/dL — AB (ref <100)
Triglycerides: 129 mg/dL (ref <150)
VLDL: 26 mg/dL (ref <30)

## 2017-07-08 LAB — CBC
HEMATOCRIT: 43.8 % (ref 35.0–45.0)
HEMOGLOBIN: 14.5 g/dL (ref 11.7–15.5)
MCH: 30 pg (ref 27.0–33.0)
MCHC: 33.1 g/dL (ref 32.0–36.0)
MCV: 90.7 fL (ref 80.0–100.0)
MPV: 10.5 fL (ref 7.5–12.5)
Platelets: 228 10*3/uL (ref 140–400)
RBC: 4.83 MIL/uL (ref 3.80–5.10)
RDW: 15.1 % — ABNORMAL HIGH (ref 11.0–15.0)
WBC: 4.7 10*3/uL (ref 3.8–10.8)

## 2017-07-08 LAB — VITAMIN D 25 HYDROXY (VIT D DEFICIENCY, FRACTURES): Vit D, 25-Hydroxy: 41 ng/mL (ref 30–100)

## 2017-07-09 ENCOUNTER — Ambulatory Visit (INDEPENDENT_AMBULATORY_CARE_PROVIDER_SITE_OTHER): Payer: 59 | Admitting: Osteopathic Medicine

## 2017-07-09 ENCOUNTER — Encounter: Payer: Self-pay | Admitting: Osteopathic Medicine

## 2017-07-09 VITALS — BP 110/62 | HR 87 | Ht 66.0 in | Wt 233.0 lb

## 2017-07-09 DIAGNOSIS — F329 Major depressive disorder, single episode, unspecified: Secondary | ICD-10-CM | POA: Diagnosis not present

## 2017-07-09 DIAGNOSIS — J302 Other seasonal allergic rhinitis: Secondary | ICD-10-CM | POA: Diagnosis not present

## 2017-07-09 DIAGNOSIS — Z7189 Other specified counseling: Secondary | ICD-10-CM | POA: Diagnosis not present

## 2017-07-09 DIAGNOSIS — F32A Depression, unspecified: Secondary | ICD-10-CM

## 2017-07-09 DIAGNOSIS — I10 Essential (primary) hypertension: Secondary | ICD-10-CM

## 2017-07-09 DIAGNOSIS — Z0000 Encounter for general adult medical examination without abnormal findings: Secondary | ICD-10-CM | POA: Diagnosis not present

## 2017-07-09 DIAGNOSIS — Z23 Encounter for immunization: Secondary | ICD-10-CM | POA: Diagnosis not present

## 2017-07-09 LAB — HIV ANTIBODY (ROUTINE TESTING W REFLEX): HIV 1&2 Ab, 4th Generation: NONREACTIVE

## 2017-07-09 MED ORDER — METOPROLOL SUCCINATE ER 25 MG PO TB24: 25.0000 mg | ORAL_TABLET | Freq: Every day | ORAL | 3 refills | Status: DC

## 2017-07-09 MED ORDER — LOSARTAN POTASSIUM 100 MG PO TABS: 100.0000 mg | ORAL_TABLET | Freq: Every day | ORAL | 3 refills | Status: DC

## 2017-07-09 MED ORDER — BUPROPION HCL ER (XL) 150 MG PO TB24: 150.0000 mg | ORAL_TABLET | Freq: Every day | ORAL | 1 refills | Status: DC

## 2017-07-09 MED ORDER — VENLAFAXINE HCL ER 75 MG PO CP24: 75.0000 mg | ORAL_CAPSULE | Freq: Every day | ORAL | 3 refills | Status: DC

## 2017-07-09 NOTE — Progress Notes (Signed)
HPI: Pamela Newman is a 61 y.o. Not Hispanic or Latino female  who presents to [facility] today, 07/09/17,  for chief complaint of:  Chief Complaint  Patient presents with  . Annual Exam    See below for preventive care review  HTN: stable on current medications   Depression: on Effexor but feeling a bit low lately, trying to lose weight. Would like to dicsuss alternatives.   Past medical, surgical, social and family history reviewed: Past Medical History:  Diagnosis Date  . Essential hypertension 11/21/2015  . Heart disease    SMALL PROLAPSE  . Hypertension   . MVP (mitral valve prolapse)    Past Surgical History:  Procedure Laterality Date  . BREAST SURGERY  2001   BREAST CYST  . CESAREAN SECTION     Social History  Substance Use Topics  . Smoking status: Never Smoker  . Smokeless tobacco: Never Used  . Alcohol use Yes   Family History  Problem Relation Age of Onset  . Cancer Mother        BREAST  . Hypertension Mother   . Hyperlipidemia Mother   . Hypertension Father   . Hyperlipidemia Father   . Stroke Father   . Cancer Sister        LUNG  . Cancer Maternal Grandfather        COLON     Current medication list and allergy/intolerance information reviewed:   Current Outpatient Prescriptions  Medication Sig Dispense Refill  . calcium carbonate (OS-CAL) 600 MG TABS tablet Take 600 mg by mouth 2 (two) times daily with a meal.    . losartan (COZAAR) 100 MG tablet Take 1 tablet (100 mg total) by mouth daily. 30 tablet 0  . metoprolol succinate (TOPROL-XL) 25 MG 24 hr tablet TAKE 1 TABLET(25 MG) BY MOUTH DAILY 90 tablet 0  . valsartan (DIOVAN) 80 MG tablet TAKE 1 TABLET(80 MG) BY MOUTH DAILY 90 tablet 0  . venlafaxine XR (EFFEXOR-XR) 75 MG 24 hr capsule TAKE 1 CAPSULE(75 MG) BY MOUTH DAILY WITH BREAKFAST 90 capsule 0   No current facility-administered medications for this visit.    No Known Allergies    Review of  Systems:  Constitutional:  No  fever, no chills, No recent illness, No unintentional weight changes. No significant fatigue.   HEENT: No  headache, no vision change, no hearing change, No sore throat, (+) sinus pressure  Cardiac: No  chest pain  Respiratory:  No  shortness of breath.  Gastrointestinal: No  abdominal pain   Musculoskeletal: No new myalgia/arthralgia  Genitourinary: No  incontinence, No  abnormal genital bleeding, No abnormal genital discharge  Skin: No Rash, No other wounds/concerning lesions  Neurologic: No  weakness, No  dizziness  Psychiatric: No  concerns with depression, No  concerns with anxiety, No sleep problems, No mood problems  Exam:  BP 110/62   Pulse 87   Ht  (1.676 m)   Wt 233 lb (105.7 kg)   BMI 37.61 kg/m   Constitutional: VS see above. General Appearance: alert, well-developed, well-nourished, NAD  Ears, Nose, Mouth, Throat: MMM, Normal external inspection ears/nares/mouth/lips/gums.   Respiratory: Normal respiratory effort.   Cardiovascular: S1/S2 normal, no murmur, no rub/gallop auscultated. RRR.  Gastrointestinal: Nontender, no masses. No hepatomegaly, no splenomegaly. No hernia appreciated. Bowel sounds normal. Rectal exam deferred.   Musculoskeletal: Gait normal. No clubbing/cyanosis of digits.   Neurological: No cranial nerve deficit on limited exam. Motor and sensation intact  and symmetric  Skin: warm, dry, intact.  Psychiatric: Normal judgment/insight. Normal mood and affect. Oriented x3.      ASSESSMENT/PLAN:   Continue current medications, ASCVD risk less than 7.5% based on current risk factors.    Annual physical exam  Essential hypertension - Plan: losartan (COZAAR) 100 MG tablet, metoprolol succinate (TOPROL-XL) 25 MG 24 hr tablet  Cardiac risk counseling - ASCVD risk based on current factors: 4.9% not in statin benefit group   Seasonal allergic rhinitis, unspecified trigger  Depression, unspecified  depression type - Plan: venlafaxine XR (EFFEXOR-XR) 75 MG 24 hr capsule, buPROPion (WELLBUTRIN XL) 150 MG 24 hr tablet  Need for shingles vaccine - Plan: Varicella-zoster vaccine IM (Shingrix)  Need for immunization against influenza - Plan: Flu Vaccine QUAD 36+ mos IM    FEMALE PREVENTIVE CARE  ANNUAL SCREENING/COUNSELING Tobacco - noNever  Alcohol - social drinker Diet/Exercise - HEALTHY HABITS DISCUSSED TO DECREASE CV RISK Depression - PQH2 Negative Domestic violence concerns - no HTN SCREENING - SEE VITALS Vaccination status - SEE BELOW  SEXUAL HEALTH Sexually active in the past year - yes With - Yes with female. STI - The patient denies history of sexually transmitted disease. STI testing today? - no  INFECTIOUS DISEASE SCREENING HIV - all adults 15-65 - does not need -previous negative GC/CT - sexually active - does not need HepC - DOB [birth year] - does not need - negative 2017 TB - does not need  DISEASE SCREENING Lipid - does not need DM2 - does not need Osteoporosis - does not need  CANCER SCREENING Cervical - done 05/2016 age 61 and normal  Breast - option now to repeat annual - has appt  Lung - does not need Colon - had 2 years ago - Fall 2015 (+) Polyps, f/u 3 years per patient - she is advised to arrange follow-up and have them send us records    ADULT VACCINATION Influenza - encouraged annual vaccine Td - was not indicated, has had 11/18/2012 HPV - was not indicated Zoster - advised update to Shingrix if desired Pneumonia - was not indicated  Immunization History  Administered Date(s) Administered  . Influenza-Unspecified 10/11/2015  . Tdap 11/18/2012  . Zoster 05/23/2014   OTHER Fall - exercise and Vit D age 63+ - does not need Consider ASA - age 63-59 - does not need but could consdier    Visit summary with medication list and pertinent instructions was printed for patient to review. All questions at time of visit were answered - patient  instructed to contact office with any additional concerns. ER/RTC precautions were reviewed with the patient. Follow-up plan: Return in about 4 weeks (around 08/06/2017) for recheck mood on new medication - sooner if needed.

## 2017-07-29 ENCOUNTER — Ambulatory Visit: Payer: 59

## 2017-08-13 ENCOUNTER — Ambulatory Visit: Payer: 59 | Admitting: Osteopathic Medicine

## 2017-08-17 ENCOUNTER — Ambulatory Visit: Payer: Self-pay | Admitting: Osteopathic Medicine

## 2017-08-26 ENCOUNTER — Ambulatory Visit (INDEPENDENT_AMBULATORY_CARE_PROVIDER_SITE_OTHER): Payer: 59

## 2017-08-26 ENCOUNTER — Encounter: Payer: Self-pay | Admitting: Osteopathic Medicine

## 2017-08-26 ENCOUNTER — Ambulatory Visit (INDEPENDENT_AMBULATORY_CARE_PROVIDER_SITE_OTHER): Payer: 59 | Admitting: Osteopathic Medicine

## 2017-08-26 VITALS — BP 112/74 | HR 96 | Ht 66.0 in | Wt 238.0 lb

## 2017-08-26 DIAGNOSIS — F32A Depression, unspecified: Secondary | ICD-10-CM

## 2017-08-26 DIAGNOSIS — Z1231 Encounter for screening mammogram for malignant neoplasm of breast: Secondary | ICD-10-CM

## 2017-08-26 DIAGNOSIS — F419 Anxiety disorder, unspecified: Secondary | ICD-10-CM | POA: Insufficient documentation

## 2017-08-26 DIAGNOSIS — F329 Major depressive disorder, single episode, unspecified: Secondary | ICD-10-CM

## 2017-08-26 NOTE — Patient Instructions (Signed)
Plan:  Continue current meds  If you'd like to increase the Wellbutrin or decrease/stop the Effexor, call/message me!

## 2017-08-26 NOTE — Progress Notes (Signed)
HPI: Pamela Newman is a 61 y.o. female  who presents to [facility] today, 08/26/17,  for chief complaint of:  Chief Complaint  Patient presents with  . Follow-up    mood    At last visit, we decided to augment Effexor with Wellbutrin.  She started the Wellbutrin a few weeks later, has been about 4 weeks total and she is starting to feel a little bit of effect from this medicine.  Noticing that moods are better, motivation has improved.  No major side effects, was feeling a little drowsy on this and then started taking in the evenings, a bit too soon to tell if this is making much of a difference   Past medical history, surgical history, social history and family history reviewed.  Patient Active Problem List   Diagnosis Date Noted  . Elevated serum creatinine 05/28/2016  . Essential hypertension 11/21/2015  . Hyperlipidemia 11/21/2015  . Postmenopause 11/21/2015    Current medication list and allergy/intolerance information reviewed.   Current Outpatient Prescriptions on File Prior to Visit  Medication Sig Dispense Refill  . buPROPion (WELLBUTRIN XL) 150 MG 24 hr tablet Take 1 tablet (150 mg total) by mouth daily. 30 tablet 1  . calcium carbonate (OS-CAL) 600 MG TABS tablet Take 600 mg by mouth 2 (two) times daily with a meal.    . losartan (COZAAR) 100 MG tablet Take 1 tablet (100 mg total) by mouth daily. 90 tablet 3  . metoprolol succinate (TOPROL-XL) 25 MG 24 hr tablet Take 1 tablet (25 mg total) by mouth daily. 90 tablet 3  . venlafaxine XR (EFFEXOR-XR) 75 MG 24 hr capsule Take 1 capsule (75 mg total) by mouth daily with breakfast. 90 capsule 3   No current facility-administered medications on file prior to visit.    No Known Allergies    Review of Systems:  Constitutional: No recent illness  Cardiac: No  chest pain  Respiratory:  No  shortness of breath  Psychiatric: No  concerns with depression, No  concerns with anxiety  Exam:   BP 112/74   Pulse 96   Ht 5\' 6"  (1.676 m)   Wt 238 lb (108 kg)   BMI 38.41 kg/m   Constitutional: VS see above. General Appearance: alert, well-developed, well-nourished, NAD  Eyes: Normal lids and conjunctive, non-icteric sclera  Ears, Nose, Mouth, Throat: MMM, Normal external inspection ears/nares/mouth/lips/gums.  Neck: No masses, trachea midline.   Respiratory: Normal respiratory effort.   Musculoskeletal: Gait normal. Symmetric and independent movement of all extremities  Neurological: Normal balance/coordination. No tremor.  Skin: warm, dry, intact.   Psychiatric: Normal judgment/insight. Normal mood and affect. Oriented x3.       ASSESSMENT/PLAN: The encounter diagnosis was Depression, unspecified depression type.    Patient Instructions  Plan:  Continue current meds  If you'd like to increase the Wellbutrin or decrease/stop the Effexor, call/message me!     Follow-up plan: [facility], ok to refill meds until then (07/2018) .  Visit summary with medication list and pertinent instructions was printed for patient to review, alert us if any changes needed. All questions at time of visit were answered - patient instructed to contact office with any additional concerns. ER/RTC precautions were reviewed with the patient and understanding verbalized.   Note: Total time spent 15 minutes, greater than 50% of the visit was spent face-to-face counseling and coordinating care for the following: The primary encounter diagnosis was Depression, unspecified depression  type. A diagnosis of Seasonal allergic rhinitis, unspecified trigger was also pertinent to this visit.Marland Kitchen

## 2017-09-19 ENCOUNTER — Other Ambulatory Visit: Payer: Self-pay | Admitting: Osteopathic Medicine

## 2017-09-19 DIAGNOSIS — F329 Major depressive disorder, single episode, unspecified: Secondary | ICD-10-CM

## 2017-09-19 DIAGNOSIS — F32A Depression, unspecified: Secondary | ICD-10-CM

## 2017-10-01 ENCOUNTER — Encounter: Payer: Self-pay | Admitting: Osteopathic Medicine

## 2017-10-01 ENCOUNTER — Ambulatory Visit (INDEPENDENT_AMBULATORY_CARE_PROVIDER_SITE_OTHER): Payer: 59 | Admitting: Osteopathic Medicine

## 2017-10-01 ENCOUNTER — Other Ambulatory Visit: Payer: Self-pay | Admitting: Osteopathic Medicine

## 2017-10-01 VITALS — BP 107/72 | HR 104 | Temp 99.5°F | Resp 18 | Wt 238.3 lb

## 2017-10-01 DIAGNOSIS — R509 Fever, unspecified: Secondary | ICD-10-CM | POA: Insufficient documentation

## 2017-10-01 DIAGNOSIS — R1032 Left lower quadrant pain: Secondary | ICD-10-CM | POA: Diagnosis not present

## 2017-10-01 DIAGNOSIS — R000 Tachycardia, unspecified: Secondary | ICD-10-CM | POA: Diagnosis not present

## 2017-10-01 DIAGNOSIS — R5383 Other fatigue: Secondary | ICD-10-CM

## 2017-10-01 DIAGNOSIS — R0602 Shortness of breath: Secondary | ICD-10-CM | POA: Diagnosis not present

## 2017-10-01 LAB — D-DIMER, QUANTITATIVE: D-Dimer, Quant: 0.91 mcg/mL FEU — ABNORMAL HIGH (ref <0.50)

## 2017-10-01 NOTE — Addendum Note (Signed)
Addended by: Deirdre PippinsALEXANDER, Herbert Aguinaldo M on: 10/01/2017 02:51 PM   Modules accepted: Orders

## 2017-10-01 NOTE — Progress Notes (Addendum)
HPI: Pamela Newman is a 61 y.o. female who  has a past medical history of Essential hypertension (11/21/2015), Heart disease, Hypertension, and MVP (mitral valve prolapse).  she presents to [facility] today, 10/01/17,  for chief complaint of:  Chief Complaint  Patient presents with  . Hypertension    Elevated pulse rate  . Cough    achy;feverx 3 wks  . Abdominal Pain    LLQ on/off x 1 week     Vague symptoms of fatigue/illness and low-grade fever. Started 3 weeks ago as fatigue/achiness, no respiratory symptoms, no diarrhea. She had a flu shot earlier this year.   Concerned about high HR. Fit-Bit device noted high pulse rate into 110's which is unusual for her. She does not feel any palpitations.   Temp up to 99.5 now, thinks a bit higher at home but no measurements.   C/o LLQ abd pain persistent past week or so w/ loose stools few times a day, nonbloody. Started as constipation then she self-treated with OTC meds and diarrhea started, got a bit better but stools are not quite back to normal. Pain is improving but not resolved. No known hx diverticular disease.   Fatigue, just feeling wiped out. Notices some mild SOB on exertion but no CP, lightheadedness, coughing.      Past medical, surgical, social and family history reviewed:  Patient Active Problem List   Diagnosis Date Noted  . Depression 08/26/2017  . Elevated serum creatinine 05/28/2016  . Essential hypertension 11/21/2015  . Hyperlipidemia 11/21/2015  . Postmenopause 11/21/2015    Past Surgical History:  Procedure Laterality Date  . BREAST BIOPSY Left    benign   . BREAST SURGERY  2001   BREAST CYST  . CESAREAN SECTION      Social History   Tobacco Use  . Smoking status: Never Smoker  . Smokeless tobacco: Never Used  Substance Use Topics  . Alcohol use: Yes    Family History  Problem Relation Age of Onset  . Cancer Mother        BREAST  . Hypertension Mother   .  Hyperlipidemia Mother   . Hypertension Father   . Hyperlipidemia Father   . Stroke Father   . Cancer Sister        LUNG  . Cancer Maternal Grandfather        COLON     Current medication list and allergy/intolerance information reviewed:    Current Outpatient Medications  Medication Sig Dispense Refill  . buPROPion (WELLBUTRIN XL) 150 MG 24 hr tablet TAKE 1 TABLET BY MOUTH EVERY DAY 30 tablet 1  . calcium carbonate (OS-CAL) 600 MG TABS tablet Take 600 mg by mouth 2 (two) times daily with a meal.    . cetirizine (ZYRTEC) 10 MG tablet Take 10 mg by mouth daily.    Marland Kitchen losartan (COZAAR) 100 MG tablet Take 1 tablet (100 mg total) by mouth daily. 90 tablet 3  . metoprolol succinate (TOPROL-XL) 25 MG 24 hr tablet Take 1 tablet (25 mg total) by mouth daily. 90 tablet 3  . Multiple Vitamin (MULTIVITAMIN) tablet Take 1 tablet by mouth daily.    Marland Kitchen venlafaxine XR (EFFEXOR-XR) 75 MG 24 hr capsule Take 1 capsule (75 mg total) by mouth daily with breakfast. 90 capsule 3   No current facility-administered medications for this visit.     No Known Allergies    Review of Systems:  Constitutional:  +fever, +chills, +recent illness, No unintentional  weight changes. +significant fatigue.   HEENT: No  headache, no vision change, no hearing change, No sore throat, No  sinus pressure  Cardiac: No  chest pain, No  pressure, No palpitations, No  Orthopnea  Respiratory:  +shortness of breath. No  Cough  Gastrointestinal: +abdominal pain, No  nausea, No  vomiting,  No  blood in stool, +diarrhea, No  constipation   Musculoskeletal: No new myalgia/arthralgia  Genitourinary: No  incontinence, No  abnormal genital bleeding, No abnormal genital discharge  Skin: No  Rash  Hem/Onc: No  easy bruising/bleeding, No  abnormal lymph node  Endocrine: No cold intolerance,  No heat intolerance. No polyuria/polydipsia/polyphagia   Neurologic: No  weakness, No  dizziness   Exam:  BP 107/72 (BP Location: Left  Arm, Patient Position: Sitting, Cuff Size: Large)   Pulse (!) 104   Temp 99.5 F (37.5 C) (Oral)   Resp 18   Wt 238 lb 4.8 oz (108.1 kg)   SpO2 96%   BMI 38.46 kg/m   Constitutional: VS see above. General Appearance: alert, well-developed, well-nourished, NAD  Eyes: Normal lids and conjunctive, non-icteric sclera  Ears, Nose, Mouth, Throat: MMM, Normal external inspection ears/nares/mouth/lips/gums.  Pharynx/tonsils no erythema, no exudate. Nasal mucosa normal.   Neck: No masses, trachea midline. No thyroid enlargement. No tenderness/mass appreciated.   Respiratory: Normal respiratory effort. no wheeze, no rhonchi, no rales  Cardiovascular: S1/S2 normal, no murmur, no rub/gallop auscultated. RRR. No lower extremity edema.   Gastrointestinal: (+)LLQ tender but no guarding/rebound, no masses. No hepatomegaly, no splenomegaly. No hernia appreciated. Bowel sounds normal. Rectal exam deferred.   Musculoskeletal: Gait normal.  Neurological: Normal balance/coordination. No tremor.   Skin: warm, dry, intact. No rash/ulcer.   Psychiatric: Normal judgment/insight. Normal mood and affect. Oriented x3.    No results found for this or any previous visit (from the past 72 hour(s)).  No results found.   ASSESSMENT/PLAN:   If symptoms hadn't been going on for 3+ weeks, I would be more suspicious of acute viral illness and don't think I'd be too worried. The prolonged elevated temperatures and tachycardia in the absence of other severe illness other than abd pain and occasional SOB is unusual.   May have started out as a mild flu since she has had the flu vaccine earlier this year, but symptoms from viral illnessshould have resolved by now at least as far as fever/temp is concerned, postviral fatigue syndrome seems reasonable though.   Constipation may have developed from dehydration d/t illness?   DDx includes at this point possible small abscess d/t diverticulitis or other GI LLQ issue  ?ovarian (no vaginal bleeding/discharge, persistent loose stool though better than it was, no bloody stool) - CT to assess intraabdominal pathology. Abscess would make sense though her mild and improving pain is reassuring   with SOB/fatigue alone normally I wouldn't worry about PE but with the tachycardia and persistent elevated temp we should r/o. Reassuring that SaO2 ok and no chest pain.    Results for orders placed or performed in visit on 10/01/17 (from the past 24 hour(s))  D-dimer, quantitative (not at [facility])     Status: Abnormal   Collection Time: 10/01/17 11:54 AM  Result Value Ref Range   D-Dimer, Quant 0.91 (H) <0.50 mcg/mL FEU    Tachycardia - Plan: CBC with Differential/Platelet, COMPLETE METABOLIC PANEL WITH GFR, TSH, Magnesium, D-dimer, quantitative (not at [facility]), CT ANGIO CHEST PE W OR WO CONTRAST  Abdominal pain, LLQ - Plan: COMPLETE METABOLIC  PANEL WITH GFR, Lipase  Fever, unspecified fever cause - Plan: CBC with Differential/Platelet, CT ANGIO CHEST PE W OR WO CONTRAST  Fatigue, unspecified type - Plan: CBC with Differential/Platelet, COMPLETE METABOLIC PANEL WITH GFR, TSH, CT ANGIO CHEST PE W OR WO CONTRAST  Shortness of breath - Plan: D-dimer, quantitative ([facility] CT ANGIO CHEST PE W OR WO CONTRAST  Left lower quadrant pain - Plan: CT Abdomen Pelvis W Contrast      Visit summary with medication list and pertinent instructions was printed for patient to review. All questions at time of visit were answered - patient instructed to contact office with any additional concerns. ER/RTC precautions were reviewed with the patient. Follow-up plan: Return for recheck depending on results and symptoms .  Note: Total time spent 25 minutes, greater than 50% of the visit was spent face-to-face counseling and coordinating care for the following: The primary encounter diagnosis was Tachycardia. Diagnoses of Abdominal pain, LLQ, Fever, unspecified fever cause, Fatigue,  unspecified type, Shortness of breath, and Left lower quadrant pain were also pertinent to this visit.Marland Kitchen.  Please note: voice recognition software was used to produce this document, and typos may escape review. Please contact Dr. Lyn HollingsheadAlexander for any needed clarifications.

## 2017-10-02 ENCOUNTER — Ambulatory Visit (INDEPENDENT_AMBULATORY_CARE_PROVIDER_SITE_OTHER): Payer: 59

## 2017-10-02 ENCOUNTER — Telehealth: Payer: Self-pay | Admitting: Osteopathic Medicine

## 2017-10-02 DIAGNOSIS — R000 Tachycardia, unspecified: Secondary | ICD-10-CM

## 2017-10-02 DIAGNOSIS — R1032 Left lower quadrant pain: Secondary | ICD-10-CM

## 2017-10-02 DIAGNOSIS — K7689 Other specified diseases of liver: Secondary | ICD-10-CM | POA: Diagnosis not present

## 2017-10-02 DIAGNOSIS — R509 Fever, unspecified: Secondary | ICD-10-CM

## 2017-10-02 DIAGNOSIS — E0789 Other specified disorders of thyroid: Secondary | ICD-10-CM

## 2017-10-02 LAB — COMPLETE METABOLIC PANEL WITH GFR
AG RATIO: 1.4 (calc) (ref 1.0–2.5)
ALBUMIN MSPROF: 3.7 g/dL (ref 3.6–5.1)
ALT: 61 U/L — ABNORMAL HIGH (ref 6–29)
AST: 44 U/L — AB (ref 10–35)
Alkaline phosphatase (APISO): 105 U/L (ref 33–130)
BUN: 10 mg/dL (ref 7–25)
CALCIUM: 9.2 mg/dL (ref 8.6–10.4)
CO2: 26 mmol/L (ref 20–32)
CREATININE: 0.96 mg/dL (ref 0.50–0.99)
Chloride: 106 mmol/L (ref 98–110)
GFR, EST AFRICAN AMERICAN: 73 mL/min/{1.73_m2} (ref >=60)
GFR, EST NON AFRICAN AMERICAN: 63 mL/min/{1.73_m2} (ref >=60)
GLOBULIN: 2.6 g/dL (ref 1.9–3.7)
Glucose, Bld: 96 mg/dL (ref 65–99)
POTASSIUM: 3.9 mmol/L (ref 3.5–5.3)
SODIUM: 140 mmol/L (ref 135–146)
Total Bilirubin: 0.7 mg/dL (ref 0.2–1.2)
Total Protein: 6.3 g/dL (ref 6.1–8.1)

## 2017-10-02 LAB — CBC WITH DIFFERENTIAL/PLATELET
BASOS ABS: 68 {cells}/uL (ref 0–200)
BASOS PCT: 1.1 %
EOS ABS: 174 {cells}/uL (ref 15–500)
Eosinophils Relative: 2.8 %
HEMATOCRIT: 35.7 % (ref 35.0–45.0)
HEMOGLOBIN: 12 g/dL (ref 11.7–15.5)
Lymphs Abs: 3553 cells/uL (ref 850–3900)
MCH: 29.6 pg (ref 27.0–33.0)
MCHC: 33.6 g/dL (ref 32.0–36.0)
MCV: 88.1 fL (ref 80.0–100.0)
MPV: 10.4 fL (ref 7.5–12.5)
Monocytes Relative: 6.5 %
NEUTROS ABS: 2003 {cells}/uL (ref 1500–7800)
Neutrophils Relative %: 32.3 %
Platelets: 183 10*3/uL (ref 140–400)
RBC: 4.05 10*6/uL (ref 3.80–5.10)
RDW: 14.2 % (ref 11.0–15.0)
Total Lymphocyte: 57.3 %
WBC: 6.2 10*3/uL (ref 3.8–10.8)
WBCMIX: 403 {cells}/uL (ref 200–950)

## 2017-10-02 LAB — LIPASE: Lipase: 34 U/L (ref 7–60)

## 2017-10-02 LAB — TSH: TSH: 1.73 mIU/L (ref 0.40–4.50)

## 2017-10-02 LAB — MAGNESIUM: Magnesium: 1.7 mg/dL (ref 1.5–2.5)

## 2017-10-02 MED ORDER — IOPAMIDOL (ISOVUE-370) INJECTION 76%
100.0000 mL | Freq: Once | INTRAVENOUS | Status: AC | PRN
  Administered 2017-10-02: 100 mL via INTRAVENOUS

## 2017-10-02 NOTE — Telephone Encounter (Signed)
Spoke to patient over the phone regarding CT results.  Nothing to really explain the fever/tachycardia.  Thankfully, no pulmonary embolus.  2 cm right thyroid mass was noted TSH levels were normal.  We will go ahead and get thyroid ultrasound for further evaluation.  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  We will go ahead and get some additional lab work including viral panels for EBV, thyroid panel and would strongly consider referral to rheumatology if anything is positive without any other evidence of infection.

## 2017-10-05 ENCOUNTER — Ambulatory Visit (INDEPENDENT_AMBULATORY_CARE_PROVIDER_SITE_OTHER): Payer: 59

## 2017-10-05 DIAGNOSIS — E042 Nontoxic multinodular goiter: Secondary | ICD-10-CM | POA: Diagnosis not present

## 2017-10-05 DIAGNOSIS — E0789 Other specified disorders of thyroid: Secondary | ICD-10-CM

## 2017-10-06 LAB — EPSTEIN-BARR VIRUS VCA ANTIBODY PANEL
EBV NA IGG: 67.9 U/mL — AB
EBV VCA IgG: 685 U/mL — ABNORMAL HIGH

## 2017-10-06 LAB — RHEUMATOID FACTOR: Rhuematoid fact SerPl-aCnc: <14 IU/mL (ref <14)

## 2017-10-06 LAB — SEDIMENTATION RATE: SED RATE: 17 mm/h (ref 0–30)

## 2017-10-06 LAB — HEPATITIS PANEL, ACUTE
Hep A IgM: NONREACTIVE
Hep B C IgM: NONREACTIVE
Hepatitis B Surface Ag: NONREACTIVE
Hepatitis C Ab: NONREACTIVE
SIGNAL TO CUT-OFF: 0.02 (ref <1.00)

## 2017-10-06 LAB — LACTATE DEHYDROGENASE: LDH: 329 U/L — AB (ref 120–250)

## 2017-10-06 LAB — CMV IGM: CMV IGM: 118 [AU]/ml — AB

## 2017-10-06 LAB — HIGH SENSITIVITY CRP: hs-CRP: 22.3 mg/L — ABNORMAL HIGH

## 2017-10-06 LAB — CK: CK TOTAL: 39 U/L (ref 29–143)

## 2017-10-06 LAB — CYTOMEGALOVIRUS ANTIBODY, IGG: CYTOMEGALOVIRUS AB-IGG: 1 U/mL — AB

## 2017-10-06 LAB — ANA: Anti Nuclear Antibody(ANA): NEGATIVE

## 2017-10-07 NOTE — Progress Notes (Unsigned)
Biopsy needed for thyroid

## 2017-10-10 LAB — CULTURE BLOOD MANUAL
MICRO NUMBER 7002: 81352967
Micro Number: 81352968
RESULT 123: NO GROWTH
Result: NO GROWTH
Specimen Quality: ADEQUATE
Specimen Quality: ADEQUATE

## 2017-10-16 ENCOUNTER — Other Ambulatory Visit (HOSPITAL_COMMUNITY)
Admission: RE | Admit: 2017-10-16 | Discharge: 2017-10-16 | Disposition: A | Discharge status: Home / Self Care | Payer: 59 | Source: Ambulatory Visit | Attending: Physician Assistant | Admitting: Physician Assistant

## 2017-10-16 ENCOUNTER — Ambulatory Visit
Admission: RE | Admit: 2017-10-16 | Discharge: 2017-10-16 | Disposition: A | Discharge status: Home / Self Care | Payer: 59 | Source: Ambulatory Visit | Attending: Osteopathic Medicine | Admitting: Osteopathic Medicine

## 2017-10-16 DIAGNOSIS — E0789 Other specified disorders of thyroid: Secondary | ICD-10-CM | POA: Insufficient documentation

## 2017-10-16 NOTE — Procedures (Signed)
PROCEDURE SUMMARY:  Using direct ultrasound guidance, 4 passes were made using 25 g needles into the nodule within the right lobe of the thyroid.   Ultrasound was used to confirm needle placements on all occasions.   Specimens were sent to Pathology for analysis.  Linh Hedberg S Gumaro Brightbill PA-C 10/16/2017 2:34 PM

## 2017-10-23 ENCOUNTER — Other Ambulatory Visit: Payer: Self-pay

## 2017-10-23 DIAGNOSIS — F329 Major depressive disorder, single episode, unspecified: Secondary | ICD-10-CM

## 2017-10-23 DIAGNOSIS — F32A Depression, unspecified: Secondary | ICD-10-CM

## 2017-10-23 MED ORDER — BUPROPION HCL ER (XL) 150 MG PO TB24: 150.0000 mg | ORAL_TABLET | Freq: Every day | ORAL | 2 refills | Status: DC

## 2018-08-14 ENCOUNTER — Other Ambulatory Visit: Payer: Self-pay | Admitting: Osteopathic Medicine

## 2018-08-14 DIAGNOSIS — F32A Depression, unspecified: Secondary | ICD-10-CM

## 2018-08-14 DIAGNOSIS — I10 Essential (primary) hypertension: Secondary | ICD-10-CM

## 2018-08-14 DIAGNOSIS — F329 Major depressive disorder, single episode, unspecified: Secondary | ICD-10-CM

## 2018-11-26 ENCOUNTER — Other Ambulatory Visit: Payer: Self-pay | Admitting: Osteopathic Medicine

## 2018-11-26 DIAGNOSIS — I10 Essential (primary) hypertension: Secondary | ICD-10-CM

## 2018-11-29 ENCOUNTER — Other Ambulatory Visit: Payer: Self-pay | Admitting: Osteopathic Medicine

## 2018-11-29 DIAGNOSIS — F329 Major depressive disorder, single episode, unspecified: Secondary | ICD-10-CM

## 2018-11-29 DIAGNOSIS — I10 Essential (primary) hypertension: Secondary | ICD-10-CM

## 2018-11-29 DIAGNOSIS — F32A Depression, unspecified: Secondary | ICD-10-CM

## 2018-12-28 ENCOUNTER — Other Ambulatory Visit: Payer: Self-pay | Admitting: Osteopathic Medicine

## 2018-12-28 DIAGNOSIS — I10 Essential (primary) hypertension: Secondary | ICD-10-CM

## 2019-01-04 ENCOUNTER — Telehealth: Payer: Self-pay | Admitting: Osteopathic Medicine

## 2019-01-04 ENCOUNTER — Other Ambulatory Visit: Payer: Self-pay | Admitting: Osteopathic Medicine

## 2019-01-04 DIAGNOSIS — E7849 Other hyperlipidemia: Secondary | ICD-10-CM

## 2019-01-04 DIAGNOSIS — Z1231 Encounter for screening mammogram for malignant neoplasm of breast: Secondary | ICD-10-CM

## 2019-01-04 DIAGNOSIS — I10 Essential (primary) hypertension: Secondary | ICD-10-CM

## 2019-01-04 DIAGNOSIS — Z0000 Encounter for general adult medical examination without abnormal findings: Secondary | ICD-10-CM

## 2019-01-04 NOTE — Telephone Encounter (Signed)
Pt advised.

## 2019-01-04 NOTE — Telephone Encounter (Signed)
Pt scheduled CPE 03/02/19. Pt request orders for labs for her to have labs a few days before CPE appt.

## 2019-01-04 NOTE — Telephone Encounter (Signed)
Orders in 

## 2019-01-19 ENCOUNTER — Ambulatory Visit: Payer: 59

## 2019-01-26 ENCOUNTER — Other Ambulatory Visit: Payer: Self-pay | Admitting: Osteopathic Medicine

## 2019-01-26 DIAGNOSIS — I10 Essential (primary) hypertension: Secondary | ICD-10-CM

## 2019-02-18 ENCOUNTER — Ambulatory Visit: Payer: Self-pay

## 2019-02-27 ENCOUNTER — Other Ambulatory Visit: Payer: Self-pay | Admitting: Osteopathic Medicine

## 2019-02-27 DIAGNOSIS — F32A Depression, unspecified: Secondary | ICD-10-CM

## 2019-02-27 DIAGNOSIS — I10 Essential (primary) hypertension: Secondary | ICD-10-CM

## 2019-02-27 DIAGNOSIS — F329 Major depressive disorder, single episode, unspecified: Secondary | ICD-10-CM

## 2019-03-01 ENCOUNTER — Other Ambulatory Visit: Payer: Self-pay

## 2019-03-01 ENCOUNTER — Ambulatory Visit (INDEPENDENT_AMBULATORY_CARE_PROVIDER_SITE_OTHER): Payer: 59 | Admitting: Osteopathic Medicine

## 2019-03-01 ENCOUNTER — Encounter: Payer: Self-pay | Admitting: Osteopathic Medicine

## 2019-03-01 DIAGNOSIS — I10 Essential (primary) hypertension: Secondary | ICD-10-CM

## 2019-03-01 DIAGNOSIS — F329 Major depressive disorder, single episode, unspecified: Secondary | ICD-10-CM | POA: Diagnosis not present

## 2019-03-01 DIAGNOSIS — F32A Depression, unspecified: Secondary | ICD-10-CM

## 2019-03-01 MED ORDER — VENLAFAXINE HCL ER 75 MG PO CP24: 75.0000 mg | ORAL_CAPSULE | Freq: Every day | ORAL | 1 refills | Status: DC

## 2019-03-01 MED ORDER — METOPROLOL SUCCINATE ER 25 MG PO TB24: 25.0000 mg | ORAL_TABLET | Freq: Every day | ORAL | 1 refills | Status: DC

## 2019-03-01 MED ORDER — BUPROPION HCL ER (XL) 150 MG PO TB24: 150.0000 mg | ORAL_TABLET | Freq: Every day | ORAL | 1 refills | Status: DC

## 2019-03-01 MED ORDER — LOSARTAN POTASSIUM 100 MG PO TABS: 100.0000 mg | ORAL_TABLET | Freq: Every day | ORAL | 1 refills | Status: DC

## 2019-03-01 NOTE — Progress Notes (Signed)
Virtual Visit  via Phone Note  I connected with      Pamela Newman on 03/01/19 at 4:00 PM by a telemedicine application and verified that I am speaking with the correct person using two identifiers.   I discussed the limitations of evaluation and management by telemedicine and the availability of in person appointments. The patient expressed understanding and agreed to proceed.  History of Present Illness: Pamela Newman is a 63 y.o. female who would like to discuss blood pressure.   Needs refill on BP meds, has everythign but the Losartan. No CP/SOB, no HA/VC, urgent care visit alst month BP reviewed and WNL    BP Readings from Last 3 Encounters:  10/01/17 107/72  08/26/17 112/74  07/09/17 110/62      Observations/Objective: Pulse 78   Wt 244 lb (110.7 kg)   BMI 39.38 kg/m  BP Readings from Last 3 Encounters:  10/01/17 107/72  08/26/17 112/74  07/09/17 110/62   Exam: Normal Speech.  NAD  Lab and Radiology Results No results found for this or any previous visit (from the past 72 hour(s)). No results found.     Assessment and Plan: 63 y.o. female with Diagnoses of Essential hypertension and Depression, unspecified depression type were pertinent to this visit.  Orders in for labs, I advised annual phsycial in office sometime in the next 6 mos!    PDMP not reviewed this encounter. No orders of the defined types were placed in this encounter.  Meds ordered this encounter  Medications  . losartan (COZAAR) 100 MG tablet    Sig: Take 1 tablet (100 mg total) by mouth daily.    Dispense:  90 tablet    Refill:  1  . buPROPion (WELLBUTRIN XL) 150 MG 24 hr tablet    Sig: Take 1 tablet (150 mg total) by mouth daily.    Dispense:  90 tablet    Refill:  1  . metoprolol succinate (TOPROL-XL) 25 MG 24 hr tablet    Sig: Take 1 tablet (25 mg total) by mouth daily.    Dispense:  90 tablet    Refill:  1  . venlafaxine XR (EFFEXOR-XR) 75 MG 24 hr capsule    Sig: Take 1  capsule (75 mg total) by mouth daily with breakfast.    Dispense:  90 capsule    Refill:  1   There are no Patient Instructions on file for this visit. Instructions sent via MyChart. If MyChart not available, pt was given option for info via personal e-mail w/ no guarantee of protected health info over unsecured e-mail communication, and MyChart sign-up instructions were included.   Follow Up Instructions: Return for Annual Physical w/ Labs by 08/2019.    I discussed the assessment and treatment plan with the patient. The patient was provided an opportunity to ask questions and all were answered. The patient agreed with the plan and demonstrated an understanding of the instructions.   The patient was advised to call back or seek an in-person evaluation if the symptoms worsen or if the condition fails to improve as anticipated.  I provided 10 minutes of non-face-to-face time during this encounter.                      Historical information moved to improve visibility of documentation.  Past Medical History:  Diagnosis Date  . Essential hypertension 11/21/2015  . Heart disease    SMALL PROLAPSE  . Hypertension   . MVP (  mitral valve prolapse)    Past Surgical History:  Procedure Laterality Date  . BREAST BIOPSY Left    benign   . BREAST SURGERY  2001   BREAST CYST  . CESAREAN SECTION     Social History   Tobacco Use  . Smoking status: Never Smoker  . Smokeless tobacco: Never Used  Substance Use Topics  . Alcohol use: Yes   family history includes Cancer in her maternal grandfather, mother, and sister; Hyperlipidemia in her father and mother; Hypertension in her father and mother; Stroke in her father.  Medications: Current Outpatient Medications  Medication Sig Dispense Refill  . calcium carbonate (OS-CAL) 600 MG TABS tablet Take 600 mg by mouth 2 (two) times daily with a meal.    . cetirizine (ZYRTEC) 10 MG tablet Take 10 mg by mouth daily.    .  Multiple Vitamin (MULTIVITAMIN) tablet Take 1 tablet by mouth daily.    Marland Kitchen buPROPion (WELLBUTRIN XL) 150 MG 24 hr tablet Take 1 tablet (150 mg total) by mouth daily. 90 tablet 1  . losartan (COZAAR) 100 MG tablet Take 1 tablet (100 mg total) by mouth daily. 90 tablet 1  . metoprolol succinate (TOPROL-XL) 25 MG 24 hr tablet Take 1 tablet (25 mg total) by mouth daily. 90 tablet 1  . venlafaxine XR (EFFEXOR-XR) 75 MG 24 hr capsule Take 1 capsule (75 mg total) by mouth daily with breakfast. 90 capsule 1   No current facility-administered medications for this visit.    No Known Allergies  PDMP not reviewed this encounter. No orders of the defined types were placed in this encounter.  Meds ordered this encounter  Medications  . losartan (COZAAR) 100 MG tablet    Sig: Take 1 tablet (100 mg total) by mouth daily.    Dispense:  90 tablet    Refill:  1  . buPROPion (WELLBUTRIN XL) 150 MG 24 hr tablet    Sig: Take 1 tablet (150 mg total) by mouth daily.    Dispense:  90 tablet    Refill:  1  . metoprolol succinate (TOPROL-XL) 25 MG 24 hr tablet    Sig: Take 1 tablet (25 mg total) by mouth daily.    Dispense:  90 tablet    Refill:  1  . venlafaxine XR (EFFEXOR-XR) 75 MG 24 hr capsule    Sig: Take 1 capsule (75 mg total) by mouth daily with breakfast.    Dispense:  90 capsule    Refill:  1

## 2019-03-02 ENCOUNTER — Encounter: Payer: 59 | Admitting: Osteopathic Medicine

## 2019-03-23 ENCOUNTER — Encounter: Payer: Medicare Other | Admitting: Osteopathic Medicine

## 2019-06-21 ENCOUNTER — Ambulatory Visit (INDEPENDENT_AMBULATORY_CARE_PROVIDER_SITE_OTHER): Payer: Medicare Other | Admitting: Physician Assistant

## 2019-06-21 ENCOUNTER — Other Ambulatory Visit: Payer: Self-pay

## 2019-06-21 ENCOUNTER — Encounter: Payer: Self-pay | Admitting: Physician Assistant

## 2019-06-21 VITALS — HR 90 | Temp 98.7°F

## 2019-06-21 DIAGNOSIS — M19011 Primary osteoarthritis, right shoulder: Secondary | ICD-10-CM

## 2019-06-21 DIAGNOSIS — J069 Acute upper respiratory infection, unspecified: Secondary | ICD-10-CM | POA: Diagnosis not present

## 2019-06-21 MED ORDER — PREDNISONE 20 MG PO TABS: 40.0000 mg | ORAL_TABLET | Freq: Every day | ORAL | 0 refills | Status: AC

## 2019-06-21 MED ORDER — IPRATROPIUM BROMIDE 0.06 % NA SOLN: 2.0000 | Freq: Four times a day (QID) | NASAL | 0 refills | Status: DC | PRN

## 2019-06-21 NOTE — Progress Notes (Signed)
Virtual Visit via Video Note  I connected with Pamela Newman on 06/21/19 at 10:30 AM EDT by a video enabled telemedicine application and verified that I am speaking with the correct person using two identifiers.   I discussed the limitations of evaluation and management by telemedicine and the availability of in person appointments. The patient expressed understanding and agreed to proceed.  History of Present Illness: HPI:                                                                Pamela Newman is a 63 y.o. female   CC: cough/nasal congestion, shoulder pain  Onset: Sunday (x 3 days) Endorses fatigue, malaise, PND, hoarseness, occasional nonproductive cough, and nasal congestion.  Denies fever, SOB/dyspnea, hemoptysis. Reports that her youngest granddaughter has similar symptoms and she had close contact this weekend. She has been taking Alka Seltzer cold and flu with mild improvement. She has a COVID test scheduled for tomorrow at [facility]  Reports worsening right shoulder pain for approx 1 week. Reports top and back of her shoulder joint feels sore radiating into her elbow. She has pain with lifting even small objects like coffee mug. She has tried Voltaren gel, Tylenol arthritis and local heat without much relief.  She has a history of R shoulder problems treated with PT and a steroid injection dating back 7-8 years ago. Denies recent injury or trauma.  Past Medical History:  Diagnosis Date  . Essential hypertension 11/21/2015  . Heart disease    SMALL PROLAPSE  . Hypertension   . MVP (mitral valve prolapse)    Past Surgical History:  Procedure Laterality Date  . BREAST BIOPSY Left    benign   . BREAST SURGERY  2001   BREAST CYST  . CESAREAN SECTION     Social History   Tobacco Use  . Smoking status: Never Smoker  . Smokeless tobacco: Never Used  Substance Use Topics  . Alcohol use: Yes   family history includes Cancer in her maternal grandfather, mother, and  sister; Hyperlipidemia in her father and mother; Hypertension in her father and mother; Stroke in her father.    ROS: negative except as noted in the HPI  Medications: Current Outpatient Medications  Medication Sig Dispense Refill  . buPROPion (WELLBUTRIN XL) 150 MG 24 hr tablet Take 1 tablet (150 mg total) by mouth daily. 90 tablet 1  . calcium carbonate (OS-CAL) 600 MG TABS tablet Take 600 mg by mouth 2 (two) times daily with a meal.    . cetirizine (ZYRTEC) 10 MG tablet Take 10 mg by mouth daily.    Marland Kitchen losartan (COZAAR) 100 MG tablet Take 1 tablet (100 mg total) by mouth daily. 90 tablet 1  . metoprolol succinate (TOPROL-XL) 25 MG 24 hr tablet Take 1 tablet (25 mg total) by mouth daily. 90 tablet 1  . Multiple Vitamin (MULTIVITAMIN) tablet Take 1 tablet by mouth daily.    Marland Kitchen venlafaxine XR (EFFEXOR-XR) 75 MG 24 hr capsule Take 1 capsule (75 mg total) by mouth daily with breakfast. 90 capsule 1   No current facility-administered medications for this visit.    No Known Allergies     Objective:  Pulse 90   Temp 98.7 F (37.1 C) (Oral)  Wt Readings from Last 3 Encounters:  03/01/19 244 lb (110.7 kg)  10/01/17 238 lb 4.8 oz (108.1 kg)  08/26/17 238 lb (108 kg)   Temp Readings from Last 3 Encounters:  06/21/19 98.7 F (37.1 C) (Oral)  10/01/17 99.5 F (37.5 C) (Oral)  03/24/17 98.2 F (36.8 C) (Oral)   BP Readings from Last 3 Encounters:  10/01/17 107/72  08/26/17 112/74  07/09/17 110/62   Pulse Readings from Last 3 Encounters:  06/21/19 90  03/01/19 78  10/01/17 (!) 104    Gen:  alert, ill-appearing, not toxic appearing, no acute distress, appropriate for age HEENT: head normocephalic without obvious abnormality, conjunctiva and cornea clear, trachea midline Pulm: Normal work of breathing, voice is slightly hoarse, speaking in full sentences Neuro: alert and oriented x 3 Psych: cooperative, , speech is articulate, normal rate and volume; thought processes clear  and goal-directed, normal judgment, good insight   No results found for this or any previous visit (from the past 72 hour(s)). No results found.    Assessment and Plan: 63 y.o. female with   .Jozi was seen today for cough, sore throat, nasal congestion, post nasal drip and arthritis.  Diagnoses and all orders for this visit:  Acute upper respiratory infection -     ipratropium (ATROVENT) 0.06 % nasal spray; Place 2 sprays into both nostrils 4 (four) times daily as needed for rhinitis.  Arthropathy of right shoulder -     predniSONE (DELTASONE) 20 MG tablet; Take 2 tablets (40 mg total) by mouth daily with breakfast for 5 days.   Patient provided vital signs reviewed. Afebrile, no tachycardia On limited physical exam, patient is not toxic-appearing, speaking in full sentences and does not appear in any respiratory distress History is suspicious for COVID-19 infection. DDx also includes other viral URI and bacterial sinusitis COVID-19 testing scheduled by patient for tomorrow. Instructed her to send MyChart message once she has test result Risk of complications is moderate Home monitoring program ordered Patient counseled to quarantine/self-monitor at home for at least 10 days from symptom onset and until symptoms improved Counseled on supportive care  After 8/25 and if symptoms have improved, she may schedule OV with Sports Medicine for shoulder pain In the meantime, will treat with Prednisone burst. Continue Tylenol arthritis and Voltaren gel    Follow Up Instructions:    I discussed the assessment and treatment plan with the patient. The patient was provided an opportunity to ask questions and all were answered. The patient agreed with the plan and demonstrated an understanding of the instructions.   The patient was advised to call back or seek an in-person evaluation if the symptoms worsen or if the condition fails to improve as anticipated.  I provided 11 minutes of  non-face-to-face time during this encounter.   Pamela Newman, [place]PA-[place]

## 2019-07-13 ENCOUNTER — Other Ambulatory Visit: Payer: Self-pay | Admitting: Physician Assistant

## 2019-07-13 DIAGNOSIS — J069 Acute upper respiratory infection, unspecified: Secondary | ICD-10-CM

## 2019-09-01 ENCOUNTER — Other Ambulatory Visit: Payer: Self-pay | Admitting: Physician Assistant

## 2019-09-01 DIAGNOSIS — J069 Acute upper respiratory infection, unspecified: Secondary | ICD-10-CM

## 2019-09-05 ENCOUNTER — Other Ambulatory Visit: Payer: Self-pay | Admitting: Osteopathic Medicine

## 2019-09-05 DIAGNOSIS — I10 Essential (primary) hypertension: Secondary | ICD-10-CM

## 2019-09-05 DIAGNOSIS — F329 Major depressive disorder, single episode, unspecified: Secondary | ICD-10-CM

## 2019-09-05 DIAGNOSIS — F32A Depression, unspecified: Secondary | ICD-10-CM

## 2019-09-05 NOTE — Telephone Encounter (Signed)
Requested medication (s) are due for refill today: yes  Requested medication (s) are on the active medication list: yes  Last refill: 06/03/2019  Future visit scheduled: no  Notes to clinic: review for refill Overdue for follow up   Requested Prescriptions  Pending Prescriptions Disp Refills   buPROPion (WELLBUTRIN XL) 150 MG 24 hr tablet [Pharmacy Med Name: BUPROPION HCL XL 150 MG TABLET] 90 tablet 1    Sig: TAKE 1 [place]     Psychiatry: Antidepressants - bupropion Failed - 09/05/2019  1:37 AM      Failed - Valid encounter within last 6 months    Recent Outpatient Visits          2 months ago Acute upper respiratory infection   [place] [facility], Elson Areas, PA-C   6 months ago Essential hypertension   [place], Lanelle Bal, DO   1 year ago [place], Lanelle Bal, DO   2 years ago Depression, unspecified depression type   [place], Lanelle Bal, DO   2 years ago Annual physical exam   [facility], Lanelle Bal, DO             Passed - Last BP in normal range    BP Readings from Last 1 Encounters:  10/01/17 107/72         Passed - Completed PHQ-2 or PHQ-9 in the last 360 days.       losartan (COZAAR) 100 MG tablet [Pharmacy Med Name: LOSARTAN POTASSIUM 100 MG TAB] 90 tablet 1    Sig: TAKE 1 TABLET BY MOUTH EVERY DAY     Cardiovascular:  Angiotensin Receptor Blockers Failed - 09/05/2019  1:37 AM      Failed - Cr in normal range and within 180 days    Creat  Date Value Ref Range Status  10/01/2017 0.96 0.50 - 0.99 mg/dL Final    Comment:    For patients >49 years of age, the reference limit for Creatinine is approximately 13% higher for people identified as African-American. .          Failed - K in normal range  and within 180 days    Potassium  Date Value Ref Range Status  10/01/2017 3.9 3.5 - 5.3 mmol/L Final         Failed - Valid encounter within last 6 months    Recent Outpatient Visits          2 months ago Acute upper respiratory infection   [place] [facility], Elson Areas, PA-C   6 months ago Essential hypertension   [place], Lanelle Bal, DO   1 year ago Tachycardia   [facility], Lanelle Bal, DO   2 years ago Depression, unspecified depression type   [place], Lanelle Bal, DO   2 years ago Annual physical exam   [facility], Lanelle Bal, [place] - Patient is not pregnant      Passed - Last BP in normal range    BP Readings from Last 1 Encounters:  10/01/17 107/72          metoprolol  succinate (TOPROL-XL) 25 MG 24 hr tablet [Pharmacy Med Name: METOPROLOL SUCC ER 25 MG TAB] 90 tablet 1    Sig: TAKE 1 TABLET BY MOUTH EVERY DAY     Cardiovascular:  Beta Blockers Failed - 09/05/2019  1:37 AM      Failed - Valid encounter within last 6 months    Recent Outpatient Visits          2 months ago Acute upper respiratory infection   [place] [facility], Bernerd Pho, PA-C   6 months ago Essential hypertension   [place] [facility], Dorene Grebe, DO   1 year ago Tachycardia   [place] [facility], Dorene Grebe, DO   2 years ago Depression, unspecified depression type   [place] [facility], Dorene Grebe, DO   2 years ago Annual physical exam   [place] [facility], Dorene Grebe, DO             Passed - Last BP in normal range    BP Readings from Last 1 Encounters:  10/01/17  107/72         Passed - Last Heart Rate in normal range    Pulse Readings from Last 1 Encounters:  06/21/19 90          venlafaxine XR (EFFEXOR-XR) 75 MG 24 hr capsule [Pharmacy Med Name: VENLAFAXINE HCL ER 75 MG CAP] 90 capsule 1    Sig: Take 1 capsule (75 mg total) by mouth daily with breakfast.     Psychiatry: Antidepressants - SNRI - desvenlafaxine & venlafaxine Failed - 09/05/2019  1:37 AM      Failed - LDL in normal range and within 360 days    LDL Cholesterol  Date Value Ref Range Status  07/07/2017 112 (H) <100 mg/dL Final         Failed - Total Cholesterol in normal range and within 360 days    Cholesterol  Date Value Ref Range Status  07/07/2017 179 <200 mg/dL Final         Failed - Triglycerides in normal range and within 360 days    Triglycerides  Date Value Ref Range Status  07/07/2017 129 <150 mg/dL Final         Failed - Valid encounter within last 6 months    Recent Outpatient Visits          2 months ago Acute upper respiratory infection   [place] [facility], Bernerd Pho, PA-C   6 months ago Essential hypertension    [facility], Dorene Grebe, DO   1 year ago Tachycardia   [facility], Dorene Grebe, DO   2 years ago Depression, unspecified depression type   [facility], Dorene Grebe, DO   2 years ago Annual physical exam   [facility], Dorene Grebe, DO             Passed - Last BP in normal range    BP Readings from Last 1 Encounters:  10/01/17 107/72         Passed - Completed PHQ-2 or PHQ-9 in the last 360 days.

## 2019-10-04 ENCOUNTER — Other Ambulatory Visit: Payer: Self-pay | Admitting: Osteopathic Medicine

## 2019-10-04 DIAGNOSIS — I10 Essential (primary) hypertension: Secondary | ICD-10-CM

## 2019-10-04 DIAGNOSIS — J069 Acute upper respiratory infection, unspecified: Secondary | ICD-10-CM

## 2019-10-04 DIAGNOSIS — F329 Major depressive disorder, single episode, unspecified: Secondary | ICD-10-CM

## 2019-10-04 DIAGNOSIS — F32A Depression, unspecified: Secondary | ICD-10-CM

## 2019-10-04 NOTE — Telephone Encounter (Signed)
Called and spoke with patient and transferred her up front to schedule follow up appointment with Dr. Sheppard Coil. KG LPN

## 2019-10-15 ENCOUNTER — Other Ambulatory Visit: Payer: Self-pay | Admitting: Osteopathic Medicine

## 2019-10-15 DIAGNOSIS — F329 Major depressive disorder, single episode, unspecified: Secondary | ICD-10-CM

## 2019-10-15 DIAGNOSIS — I10 Essential (primary) hypertension: Secondary | ICD-10-CM

## 2019-10-15 DIAGNOSIS — F32A Depression, unspecified: Secondary | ICD-10-CM

## 2019-10-16 NOTE — Telephone Encounter (Signed)
Requested medication (s) are due for refill today: yes  Requested medication (s) are on the active medication list: yes  Last refill:  10/04/2019  Future visit scheduled: no  Notes to clinic: review for refill   Requested Prescriptions  Pending Prescriptions Disp Refills   venlafaxine XR (EFFEXOR-XR) 75 MG 24 hr capsule [Pharmacy Med Name: VENLAFAXINE HCL ER 75 MG CAP] 90 capsule 1    Sig: TAKE 1 CAPSULE (75 MG TOTAL) BY MOUTH DAILY WITH BREAKFAST. NEEDS APPOINTMENT      Psychiatry: Antidepressants - SNRI - desvenlafaxine & venlafaxine Failed - 10/15/2019  8:37 AM      Failed - LDL in normal range and within 360 days    LDL Cholesterol  Date Value Ref Range Status  07/07/2017 112 (H) <100 mg/dL Final          Failed - Total Cholesterol in normal range and within 360 days    Cholesterol  Date Value Ref Range Status  07/07/2017 179 <200 mg/dL Final          Failed - Triglycerides in normal range and within 360 days    Triglycerides  Date Value Ref Range Status  07/07/2017 129 <150 mg/dL Final          Failed - Valid encounter within last 6 months    Recent Outpatient Visits           3 months ago Acute upper respiratory infection   [place] [facility], Bernerd Pho, PA-C   7 months ago Essential hypertension   [place] [facility], Dorene Grebe, DO   2 years ago Tachycardia   [facility], Dorene Grebe, DO   2 years ago Depression, unspecified depression type   [place] [facility], Dorene Grebe, DO   2 years ago Annual physical exam   [facility], [place], DO              Passed - Completed PHQ-2 or PHQ-9 in the last 360 days.      Passed - Last BP in normal range    BP Readings from Last 1 Encounters:  10/01/17 107/72            buPROPion (WELLBUTRIN XL) 150 MG  24 hr tablet [Pharmacy Med Name: BUPROPION HCL XL 150 MG TABLET] 90 tablet 1    Sig: TAKE 1 TABLET (150 MG TOTAL) BY MOUTH DAILY. NEEDS APPOINTMENT      Psychiatry: Antidepressants - bupropion Failed - 10/15/2019  8:37 AM      Failed - Valid encounter within last 6 months    Recent Outpatient Visits           3 months ago Acute upper respiratory infection   [place] [facility], Bernerd Pho, PA-C   7 months ago Essential hypertension   [place] [facility], Dorene Grebe, DO   2 years ago Tachycardia   [facility], Dorene Grebe, DO   2 years ago Depression, unspecified depression type   [place] [facility], Dorene Grebe, DO   2 years ago Annual physical exam   [facility], [place], DO              Passed - Completed PHQ-2 or PHQ-9 in the last 360 days.  Passed - Last BP in normal range    BP Readings from Last 1 Encounters:  10/01/17 107/72

## 2019-10-16 NOTE — Telephone Encounter (Signed)
Requested medication (s) are due for refill today: yes  Requested medication (s) are on the active medication list: yes  Last refill:  10/04/2019  Future visit scheduled: yes  Notes to clinic:  review for refill   Requested Prescriptions  Pending Prescriptions Disp Refills   metoprolol succinate (TOPROL-XL) 25 MG 24 hr tablet [Pharmacy Med Name: METOPROLOL SUCC ER 25 MG TAB] 15 tablet 0    Sig: TAKE 1 TABLET (25 MG TOTAL) BY MOUTH DAILY. NEEDS APPOINTMENT      Cardiovascular:  Beta Blockers Failed - 10/15/2019  8:35 AM      Failed - Valid encounter within last 6 months    Recent Outpatient Visits           3 months ago Acute upper respiratory infection   [place] [facility], Bernerd Pho, PA-C   7 months ago Essential hypertension   [place] [facility], Dorene Grebe, DO   2 years ago Tachycardia   [facility], Dorene Grebe, DO   2 years ago Depression, unspecified depression type   [facility], Dorene Grebe, DO   2 years ago Annual physical exam   [place] [facility], Dorene Grebe, DO              Passed - Last BP in normal range    BP Readings from Last 1 Encounters:  10/01/17 107/72          Passed - Last Heart Rate in normal range    Pulse Readings from Last 1 Encounters:  06/21/19 90            losartan (COZAAR) 100 MG tablet [Pharmacy Med Name: LOSARTAN POTASSIUM 100 MG TAB] 15 tablet 0    Sig: TAKE 1 TABLET (100 MG TOTAL) BY MOUTH DAILY. NEEDS APPOINTMENT      Cardiovascular:  Angiotensin Receptor Blockers Failed - 10/15/2019  8:35 AM      Failed - Cr in normal range and within 180 days    Creat  Date Value Ref Range Status  10/01/2017 0.96 0.50 - 0.99 mg/dL Final    Comment:    For patients >84 years of age, the reference limit for Creatinine is approximately 13%  higher for people identified as African-American. .           Failed - K in normal range and within 180 days    Potassium  Date Value Ref Range Status  10/01/2017 3.9 3.5 - 5.3 mmol/L Final          Failed - Valid encounter within last 6 months    Recent Outpatient Visits           3 months ago Acute upper respiratory infection   [place] [facility], Bernerd Pho, PA-C   7 months ago Essential hypertension   [place] [facility], Dorene Grebe, DO   2 years ago Tachycardia   [facility], Dorene Grebe, DO   2 years ago Depression, unspecified depression type   [facility], Dorene Grebe, DO   2 years ago Annual physical exam   [facility], Dorene Grebe, DO              Passed - Patient is not pregnant  Passed - Last BP in normal range    BP Readings from Last 1 Encounters:  10/01/17 107/72

## 2019-10-25 ENCOUNTER — Ambulatory Visit: Payer: Medicare Other | Admitting: Osteopathic Medicine

## 2019-10-25 NOTE — Progress Notes (Unsigned)
Subjective:   Pamela Newman is a 63 y.o. female who presents for an Initial Medicare Annual Wellness Visit.  Review of Systems    No ROS.  Medicare Wellness Virtual Visit.  Visual/audio telehealth visit, UTA vital signs.   See social history for additional risk factors.       Sleep patterns:  Home Safety/Smoke Alarms: Feels safe in home. Smoke alarms in place.  Living environment; Seat Belt Safety/Bike Helmet: Wears seat belt.   Female:   Pap-  Aged  out     Mammo-       Dexa scan-        CCS-     Objective:    There were no vitals filed for this visit. There is no height or weight on file to calculate BMI.  Advanced Directives 11/21/2015  Does Patient Have a Medical Advance Directive? Yes  Type of Estate agent of [place];Living will  Does patient want to make changes to medical advance directive? No - Patient declined  Copy of Healthcare Power of Attorney in Chart? Yes    Current Medications (verified) Outpatient Encounter Medications as of 11/07/2019  Medication Sig  . acetaminophen (TYLENOL) 650 MG CR tablet Take 650 mg by mouth every 8 (eight) hours as needed.  Marland Kitchen buPROPion (WELLBUTRIN XL) 150 MG 24 hr tablet TAKE 1 TABLET (150 MG TOTAL) BY MOUTH DAILY. NEEDS APPOINTMENT  . calcium carbonate (OS-CAL) 600 MG TABS tablet Take 600 mg by mouth 2 (two) times daily with a meal.  . cetirizine (ZYRTEC) 10 MG tablet Take 10 mg by mouth daily.  . diclofenac sodium (VOLTAREN) 1 % GEL Apply topically 4 (four) times daily.  Marland Kitchen ipratropium (ATROVENT) 0.06 % nasal spray SPRAY 2 SPRAYS INTO BOTH NOSTRILS 4 (FOUR) TIMES DAILY AS NEEDED FOR RHINITIS.  Marland Kitchen losartan (COZAAR) 100 MG tablet TAKE 1 TABLET (100 MG TOTAL) BY MOUTH DAILY. NEEDS APPOINTMENT  . metoprolol succinate (TOPROL-XL) 25 MG 24 hr tablet TAKE 1 TABLET (25 MG TOTAL) BY MOUTH DAILY. NEEDS APPOINTMENT  . Multiple Vitamin (MULTIVITAMIN) tablet Take 1 tablet by mouth daily.  Marland Kitchen venlafaxine XR (EFFEXOR-XR)  75 MG 24 hr capsule TAKE 1 CAPSULE (75 MG TOTAL) BY MOUTH DAILY WITH BREAKFAST. NEEDS APPOINTMENT   No facility-administered encounter medications on file as of 11/07/2019.    Allergies (verified) Patient has no known allergies.   History: Past Medical History:  Diagnosis Date  . Essential hypertension 11/21/2015  . Heart disease    SMALL PROLAPSE  . Hypertension   . MVP (mitral valve prolapse)    Past Surgical History:  Procedure Laterality Date  . BREAST BIOPSY Left    benign   . BREAST SURGERY  2001   BREAST CYST  . CESAREAN SECTION     Family History  Problem Relation Age of Onset  . Cancer Mother        BREAST  . Hypertension Mother   . Hyperlipidemia Mother   . Hypertension Father   . Hyperlipidemia Father   . Stroke Father   . Cancer Sister        LUNG  . Cancer Maternal Grandfather        COLON   Social History   Socioeconomic History  . Marital status: Married    Spouse name: Not on file  . Number of children: Not on file  . Years of education: Not on file  . Highest education level: Not on file  Occupational History  . Not on  file  Tobacco Use  . Smoking status: Never Smoker  . Smokeless tobacco: Never Used  Substance and Sexual Activity  . Alcohol use: Yes  . Drug use: No  . Sexual activity: Not on file  Other Topics Concern  . Not on file  Social History Narrative  . Not on file   Social Determinants of Health   Financial Resource Strain:   . Difficulty of Paying Living Expenses: Not on file  Food Insecurity:   . Worried About Programme researcher, broadcasting/film/videounning Out of Food in the Last Year: Not on file  . Ran Out of Food in the Last Year: Not on file  Transportation Needs:   . Lack of Transportation (Medical): Not on file  . Lack of Transportation (Non-Medical): Not on file  Physical Activity:   . Days of Exercise per Week: Not on file  . Minutes of Exercise per Session: Not on file  Stress:   . Feeling of Stress : Not on file  Social Connections:   .  Frequency of Communication with Friends and Family: Not on file  . Frequency of Social Gatherings with Friends and Family: Not on file  . Attends Religious Services: Not on file  . Active Member of Clubs or Organizations: Not on file  . Attends BankerClub or Organization Meetings: Not on file  . Marital Status: Not on file    Tobacco Counseling Counseling given: Not Answered   Clinical Intake:                        Activities of Daily Living No flowsheet data found.   Immunizations and Health Maintenance Immunization History  Administered Date(s) Administered  . Influenza, High Dose Seasonal PF 01/19/2019  . Influenza,inj,Quad PF,6+ Mos 07/09/2017  . Influenza-Unspecified 10/11/2015  . Tdap 11/18/2012  . Zoster 05/23/2014  . Zoster Recombinat (Shingrix) 07/09/2017   Health Maintenance Due  Topic Date Due  . DEXA SCAN  12/22/2018  . PNA vac Low Risk Adult (1 of 2 - PCV13) 12/22/2018  . PAP SMEAR-Modifier  05/29/2019  . INFLUENZA VACCINE  06/04/2019  . COLONOSCOPY  08/04/2019  . MAMMOGRAM  08/27/2019    Patient Care Team: Sunnie NielsenAlexander, Natalie, DO as PCP - General (Osteopathic Medicine)  Indicate any recent Medical Services you may have received from other than [facility] providers in the past year (date may be approximate).     Assessment:   This is a routine wellness examination for Pamela Newman.Physical assessment deferred to PCP.   Hearing/Vision screen No exam data present  Dietary issues and exercise activities discussed:   Diet  Breakfast: Lunch:  Dinner:       Goals   None    Depression Screen PHQ 2/9 Scores 03/01/2019 07/09/2017 06/12/2017  PHQ - 2 Score 1 3 0  PHQ- 9 Score - 11 -    Fall Risk No flowsheet data found.  Is the patient's home free of loose throw rugs in walkways, pet beds, electrical cords, etc?   {Blank single:19197::"yes","no"}      Grab bars in the bathroom? {Blank single:19197::"yes","no"}      Handrails on the stairs?   {Blank  single:19197::"yes","no"}      Adequate lighting?   {Blank single:19197::"yes","no"}  Cognitive Function:        Screening Tests Health Maintenance  Topic Date Due  . DEXA SCAN  12/22/2018  . PNA vac Low Risk Adult (1 of 2 - PCV13) 12/22/2018  . PAP SMEAR-Modifier  05/29/2019  .  INFLUENZA VACCINE  06/04/2019  . COLONOSCOPY  08/04/2019  . MAMMOGRAM  08/27/2019  . TETANUS/TDAP  11/18/2022  . Hepatitis C Screening  Completed  . HIV Screening  Completed       Plan:   ***  I have personally reviewed and noted the following in the patient's chart:   . Medical and social history . Use of alcohol, tobacco or illicit drugs  . Current medications and supplements . Functional ability and status . Nutritional status . Physical activity . Advanced directives . List of other physicians . Hospitalizations, surgeries, and ER visits in previous 12 months . Vitals . Screenings to include cognitive, depression, and falls . Referrals and appointments  In addition, I have reviewed and discussed with patient certain preventive protocols, quality metrics, and best practice recommendations. A written personalized care plan for preventive services as well as general preventive health recommendations were provided to patient.     Joanne Chars, LPN   [id number]/83/[id number]

## 2019-10-30 ENCOUNTER — Other Ambulatory Visit: Payer: Self-pay | Admitting: Osteopathic Medicine

## 2019-10-30 DIAGNOSIS — J069 Acute upper respiratory infection, unspecified: Secondary | ICD-10-CM

## 2019-11-04 ENCOUNTER — Other Ambulatory Visit: Payer: Self-pay | Admitting: Osteopathic Medicine

## 2019-11-04 DIAGNOSIS — J069 Acute upper respiratory infection, unspecified: Secondary | ICD-10-CM

## 2019-11-06 ENCOUNTER — Other Ambulatory Visit: Payer: Self-pay | Admitting: Osteopathic Medicine

## 2019-11-06 DIAGNOSIS — I10 Essential (primary) hypertension: Secondary | ICD-10-CM

## 2019-11-07 ENCOUNTER — Ambulatory Visit: Payer: Medicare Other

## 2019-11-11 ENCOUNTER — Other Ambulatory Visit: Payer: Self-pay

## 2019-11-12 ENCOUNTER — Other Ambulatory Visit: Payer: Self-pay | Admitting: Osteopathic Medicine

## 2019-11-12 DIAGNOSIS — F329 Major depressive disorder, single episode, unspecified: Secondary | ICD-10-CM

## 2019-11-12 DIAGNOSIS — F32A Depression, unspecified: Secondary | ICD-10-CM

## 2019-11-12 NOTE — Telephone Encounter (Signed)
Requested medication (s) are due for refill today: yes  Requested medication (s) are on the active medication list: yes  Last refill:  10/04/2019  Future visit scheduled: no  Notes to clinic: review for refill   Requested Prescriptions  Pending Prescriptions Disp Refills   venlafaxine XR (EFFEXOR-XR) 75 MG 24 hr capsule [Pharmacy Med Name: VENLAFAXINE HCL ER 75 MG CAP] 15 capsule 0    Sig: TAKE 1 CAPSULE (75 MG TOTAL) BY MOUTH DAILY WITH BREAKFAST. NEEDS APPOINTMENT      Psychiatry: Antidepressants - SNRI - desvenlafaxine & venlafaxine Failed - 11/12/2019  6:57 PM      Failed - LDL in normal range and within 360 days    LDL Cholesterol  Date Value Ref Range Status  07/07/2017 112 (H) <100 mg/dL Final          Failed - Total Cholesterol in normal range and within 360 days    Cholesterol  Date Value Ref Range Status  07/07/2017 179 <200 mg/dL Final          Failed - Triglycerides in normal range and within 360 days    Triglycerides  Date Value Ref Range Status  07/07/2017 129 <150 mg/dL Final          Failed - Valid encounter within last 6 months    Recent Outpatient Visits           4 months ago Acute upper respiratory infection   [place] [facility], Bernerd Pho, PA-C   8 months ago Essential hypertension   [place] [facility], Dorene Grebe, DO   2 years ago Tachycardia   [facility], Dorene Grebe, DO   2 years ago Depression, unspecified depression type   [place] [facility], Dorene Grebe, DO   2 years ago Annual physical exam   [facility], [place], DO              Passed - Completed PHQ-2 or PHQ-9 in the last 360 days.      Passed - Last BP in normal range    BP Readings from Last 1 Encounters:  10/01/17 107/72            buPROPion (WELLBUTRIN XL) 150 MG 24  hr tablet [Pharmacy Med Name: BUPROPION HCL XL 150 MG TABLET] 15 tablet 0    Sig: TAKE 1 TABLET (150 MG TOTAL) BY MOUTH DAILY. NEEDS APPOINTMENT      Psychiatry: Antidepressants - bupropion Failed - 11/12/2019  6:57 PM      Failed - Valid encounter within last 6 months    Recent Outpatient Visits           4 months ago Acute upper respiratory infection   [place] [facility], Bernerd Pho, PA-C   8 months ago Essential hypertension   [place] [facility], Dorene Grebe, DO   2 years ago Tachycardia   [facility], Dorene Grebe, DO   2 years ago Depression, unspecified depression type    [facility], Dorene Grebe, DO   2 years ago Annual physical exam   [facility], [place], DO              Passed - Completed PHQ-2 or PHQ-9 in the last 360 days.  Passed - Last BP in normal range    BP Readings from Last 1 Encounters:  10/01/17 107/72

## 2019-11-16 ENCOUNTER — Ambulatory Visit: Payer: Medicare Other

## 2019-11-30 ENCOUNTER — Ambulatory Visit: Payer: Medicare Other

## 2019-12-01 ENCOUNTER — Other Ambulatory Visit: Payer: Self-pay

## 2019-12-01 ENCOUNTER — Ambulatory Visit: Payer: Medicare Other

## 2019-12-01 ENCOUNTER — Other Ambulatory Visit: Payer: Self-pay | Admitting: Osteopathic Medicine

## 2019-12-01 ENCOUNTER — Ambulatory Visit (INDEPENDENT_AMBULATORY_CARE_PROVIDER_SITE_OTHER): Payer: Medicare Other

## 2019-12-01 DIAGNOSIS — J069 Acute upper respiratory infection, unspecified: Secondary | ICD-10-CM

## 2019-12-01 DIAGNOSIS — Z1231 Encounter for screening mammogram for malignant neoplasm of breast: Secondary | ICD-10-CM

## 2019-12-01 NOTE — Telephone Encounter (Signed)
Requested medication (s) are due for refill today: no  Requested medication (s) are on the active medication list:no  Last refill: 11/06/2019  Future visit scheduled: no  Notes to clinic:  review for refill   Requested Prescriptions  Pending Prescriptions Disp Refills   ipratropium (ATROVENT) 0.06 % nasal spray [Pharmacy Med Name: IPRATROPIUM 0.06% SPRAY] 15 mL 1    Sig: SPRAY 2 SPRAYS INTO BOTH NOSTRILS 4 TIMES DAILY AS NEEDED FOR RHINITIS.      Off-Protocol Failed - 12/01/2019  8:32 AM      Failed - Medication not assigned to a protocol, review manually.      Failed - Valid encounter within last 12 months    Recent Outpatient Visits           5 months ago Acute upper respiratory infection   [place] [facility], Bernerd Pho, PA-C   9 months ago Essential hypertension   [place] [facility], Dorene Grebe, DO   2 years ago Tachycardia   [facility], Dorene Grebe, DO   2 years ago Depression, unspecified depression type   [facility], Dorene Grebe, DO   2 years ago Annual physical exam   [facility], Dorene Grebe, DO             Off-Protocol Failed - 12/01/2019  8:32 AM      Failed - Medication not assigned to a protocol, review manually.      Failed - Valid encounter within last 12 months    Recent Outpatient Visits           5 months ago Acute upper respiratory infection   [place] [facility], Bernerd Pho, PA-C   9 months ago Essential hypertension   [place] [facility], Dorene Grebe, DO   2 years ago Tachycardia   [facility], Dorene Grebe, DO   2 years ago Depression, unspecified depression type   [facility],  Dorene Grebe, DO   2 years ago Annual physical exam   [facility], Dorene Grebe, DO

## 2019-12-03 ENCOUNTER — Other Ambulatory Visit: Payer: Self-pay | Admitting: Osteopathic Medicine

## 2019-12-03 DIAGNOSIS — I10 Essential (primary) hypertension: Secondary | ICD-10-CM

## 2019-12-06 LAB — COMPLETE METABOLIC PANEL WITH GFR
AG Ratio: 1.9 (calc) (ref 1.0–2.5)
ALT: 32 U/L — ABNORMAL HIGH (ref 6–29)
AST: 22 U/L (ref 10–35)
Albumin: 4.3 g/dL (ref 3.6–5.1)
Alkaline phosphatase (APISO): 61 U/L (ref 37–153)
BUN: 14 mg/dL (ref 7–25)
CO2: 26 mmol/L (ref 20–32)
Calcium: 9.4 mg/dL (ref 8.6–10.4)
Chloride: 108 mmol/L (ref 98–110)
Creat: 0.93 mg/dL (ref 0.50–0.99)
GFR, Est African American: 75 mL/min/{1.73_m2} (ref >=60)
GFR, Est Non African American: 64 mL/min/{1.73_m2} (ref >=60)
Globulin: 2.3 g/dL (calc) (ref 1.9–3.7)
Glucose, Bld: 103 mg/dL — ABNORMAL HIGH (ref 65–99)
Potassium: 3.8 mmol/L (ref 3.5–5.3)
Sodium: 142 mmol/L (ref 135–146)
Total Bilirubin: 0.3 mg/dL (ref 0.2–1.2)
Total Protein: 6.6 g/dL (ref 6.1–8.1)

## 2019-12-06 LAB — CBC
HCT: 40.8 % (ref 35.0–45.0)
Hemoglobin: 13.9 g/dL (ref 11.7–15.5)
MCH: 30 pg (ref 27.0–33.0)
MCHC: 34.1 g/dL (ref 32.0–36.0)
MCV: 88.1 fL (ref 80.0–100.0)
MPV: 11.3 fL (ref 7.5–12.5)
Platelets: 190 10*3/uL (ref 140–400)
RBC: 4.63 10*6/uL (ref 3.80–5.10)
RDW: 13.8 % (ref 11.0–15.0)
WBC: 5.5 10*3/uL (ref 3.8–10.8)

## 2019-12-06 LAB — LIPID PANEL
Cholesterol: 186 mg/dL (ref <200)
HDL: 31 mg/dL — ABNORMAL LOW (ref >=50)
LDL Cholesterol (Calc): 110 mg/dL (calc) — ABNORMAL HIGH
Non-HDL Cholesterol (Calc): 155 mg/dL (calc) — ABNORMAL HIGH (ref <130)
Total CHOL/HDL Ratio: 6 (calc) — ABNORMAL HIGH (ref <5.0)
Triglycerides: 339 mg/dL — ABNORMAL HIGH (ref <150)

## 2019-12-06 LAB — TSH: TSH: 1.49 mIU/L (ref 0.40–4.50)

## 2019-12-07 ENCOUNTER — Ambulatory Visit (INDEPENDENT_AMBULATORY_CARE_PROVIDER_SITE_OTHER): Payer: Medicare Other | Admitting: Osteopathic Medicine

## 2019-12-07 ENCOUNTER — Ambulatory Visit: Payer: Medicare Other | Admitting: Osteopathic Medicine

## 2019-12-07 ENCOUNTER — Encounter: Payer: Self-pay | Admitting: Osteopathic Medicine

## 2019-12-07 VITALS — BP 117/70 | HR 76 | Temp 98.6°F | Ht 65.0 in | Wt 245.0 lb

## 2019-12-07 DIAGNOSIS — F32A Depression, unspecified: Secondary | ICD-10-CM

## 2019-12-07 DIAGNOSIS — Z780 Asymptomatic menopausal state: Secondary | ICD-10-CM

## 2019-12-07 DIAGNOSIS — I10 Essential (primary) hypertension: Secondary | ICD-10-CM

## 2019-12-07 DIAGNOSIS — E7849 Other hyperlipidemia: Secondary | ICD-10-CM

## 2019-12-07 DIAGNOSIS — Z86010 Personal history of colonic polyps: Secondary | ICD-10-CM

## 2019-12-07 DIAGNOSIS — F329 Major depressive disorder, single episode, unspecified: Secondary | ICD-10-CM

## 2019-12-07 DIAGNOSIS — E781 Pure hyperglyceridemia: Secondary | ICD-10-CM

## 2019-12-07 DIAGNOSIS — R7301 Impaired fasting glucose: Secondary | ICD-10-CM

## 2019-12-07 DIAGNOSIS — M899 Disorder of bone, unspecified: Secondary | ICD-10-CM

## 2019-12-07 MED ORDER — METOPROLOL SUCCINATE ER 25 MG PO TB24: 25.0000 mg | ORAL_TABLET | Freq: Every day | ORAL | 3 refills | Status: DC

## 2019-12-07 MED ORDER — CETIRIZINE HCL 10 MG PO TABS: 10.0000 mg | ORAL_TABLET | Freq: Two times a day (BID) | ORAL | 3 refills | Status: DC

## 2019-12-07 MED ORDER — BUPROPION HCL ER (XL) 150 MG PO TB24: 150.0000 mg | ORAL_TABLET | Freq: Every day | ORAL | 3 refills | Status: DC

## 2019-12-07 MED ORDER — VENLAFAXINE HCL ER 75 MG PO CP24: 75.0000 mg | ORAL_CAPSULE | Freq: Every day | ORAL | 3 refills | Status: DC

## 2019-12-07 MED ORDER — LOSARTAN POTASSIUM 100 MG PO TABS: 100.0000 mg | ORAL_TABLET | Freq: Every day | ORAL | 3 refills | Status: DC

## 2019-12-07 NOTE — Patient Instructions (Addendum)
General Preventive Care  Most recent routine screening lipids/other labs: done!  Cholesterol:   Diabetes screening:   Everyone should have blood pressure checked once per year.   Tobacco: don't!   Alcohol: responsible moderation is ok for most adults - if you have concerns about your alcohol intake, please talk to me!   Exercise: as tolerated to reduce risk of cardiovascular disease and diabetes. Strength training will also prevent osteoporosis.   Mental health: if need for mental health care (medicines, counseling, other), or concerns about moods, please let me know!   Sexual health: if need for STD testing, or if concerns with libido/pain problems, please let me know!   Advanced Directive: Living Will and/or Healthcare Power of Attorney recommended for all adults, regardless of age or health.  Vaccines  Flu vaccine: recommended for almost everyone, every fall.   Shingles vaccine: Shingrix recommended after age 60. We have one shot on file for you! DId you get the second one?   Pneumonia vaccines: Pneumovax recommended at age 68  Tetanus booster: Tdap recommended every 10 years. Due 2024 Immunization History  Administered Date(s) Administered  . Influenza, High Dose Seasonal PF 01/19/2019, 11/11/2019  . Influenza,inj,Quad PF,6+ Mos 07/09/2017  . Influenza-Unspecified 11/01/2007, 10/11/2015  . Td 11/03/1998  . Tdap 11/18/2012  . Zoster 05/23/2014  . Zoster Recombinat (Shingrix) 07/09/2017    Cancer screenings   Colon cancer screening: due 08/2019  Breast cancer screening: mammogram normal 11/2019, due for repeat 11/2020  Cervical cancer screening: Pap every 1 to 5 years depending on age and other risk factors. Can usually stop at age 31 or w/ hysterectomy.   Lung cancer screening: not needed for nonsmokers  Infection screenings . HIV, Gonorrhea/Chlamydia: screening as needed . Hepatitis C: recommended for anyone born 59-1967 . TB: certain at-risk populations, or  depending on work requirements and/or travel history Other . Bone Density Test: recommended for women at age 73

## 2019-12-07 NOTE — Progress Notes (Signed)
Virtual Visit via Phone  I connected with      Pamela Newman on 12/07/19 at 4:04 PM  by a telemedicine application and verified that I am speaking with the correct person using two identifiers.  Patient is at home I am in office   I discussed the limitations of evaluation and management by telemedicine and the availability of in person appointments. The patient expressed understanding and agreed to proceed.  History of Present Illness: Pamela Newman is a 64 y.o. female who would like to discuss COVID vaccien questions, allergies, BP followup, mood followup  Labs (+)elevated fasting glucose and high TG Chronic conditions stable on current meds, see below A/P       Observations/Objective: BP 117/70   Pulse 76   Temp 98.6 F (37 C) (Oral)   Ht 5\' 5"  (1.651 m)   Wt 245 lb (111.1 kg)   BMI 40.77 kg/m  BP Readings from Last 3 Encounters:  12/07/19 117/70  10/01/17 107/72  08/26/17 112/74   Exam: Normal Speech.  NAD  Lab and Radiology Results Results for orders placed or performed in visit on 01/04/19 (from the past 72 hour(s))  CBC     Status: None   Collection Time: 12/05/19  2:48 PM  Result Value Ref Range   WBC 5.5 3.8 - 10.8 Thousand/uL   RBC 4.63 3.80 - 5.10 Million/uL   Hemoglobin 13.9 11.7 - 15.5 g/dL   HCT 02/02/20 78.2 - 95.6 %   MCV 88.1 80.0 - 100.0 fL   MCH 30.0 27.0 - 33.0 pg   MCHC 34.1 32.0 - 36.0 g/dL   RDW 21.3 08.6 - 57.8 %   Platelets 190 140 - 400 Thousand/uL   MPV 11.3 7.5 - 12.5 fL  COMPLETE METABOLIC PANEL WITH GFR     Status: Abnormal   Collection Time: 12/05/19  2:48 PM  Result Value Ref Range   Glucose, Bld 103 (H) 65 - 99 mg/dL    Comment: .            Fasting reference interval . For someone without known diabetes, a glucose value between 100 and 125 mg/dL is consistent with prediabetes and should be confirmed with a follow-up test. .    BUN 14 7 - 25 mg/dL   Creat 02/02/20 6.29 - 5.28 mg/dL    Comment: For patients >49 years of  age, the reference limit for Creatinine is approximately 13% higher for people identified as African-American. .    GFR, Est Non African American 64 > OR = 60 mL/min/1.35m2   GFR, Est African American 75 > OR = 60 mL/min/1.69m2   BUN/Creatinine Ratio NOT APPLICABLE 6 - 22 (calc)   Sodium 142 135 - 146 mmol/L   Potassium 3.8 3.5 - 5.3 mmol/L   Chloride 108 98 - 110 mmol/L   CO2 26 20 - 32 mmol/L   Calcium 9.4 8.6 - 10.4 mg/dL   Total Protein 6.6 6.1 - 8.1 g/dL   Albumin 4.3 3.6 - 5.1 g/dL   Globulin 2.3 1.9 - 3.7 g/dL (calc)   AG Ratio 1.9 1.0 - 2.5 (calc)   Total Bilirubin 0.3 0.2 - 1.2 mg/dL   Alkaline phosphatase (APISO) 61 37 - 153 U/L   AST 22 10 - 35 U/L   ALT 32 (H) 6 - 29 U/L  Lipid Profile     Status: Abnormal   Collection Time: 12/05/19  2:48 PM  Result Value Ref Range   Cholesterol 186 <200  mg/dL   HDL 31 (L) > OR = 50 mg/dL   Triglycerides 339 (H) <150 mg/dL    Comment: . If a non-fasting specimen was collected, consider repeat triglyceride testing on a fasting specimen if clinically indicated.  Yates Decamp et al. J. of Clin. Lipidol. 6433;2:951-884. Marland Kitchen    LDL Cholesterol (Calc) 110 (H) mg/dL (calc)    Comment: Reference range: <100 . Desirable range <100 mg/dL for primary prevention;   <70 mg/dL for patients with CHD or diabetic patients  with > or = 2 CHD risk factors. Marland Kitchen LDL-C is now calculated using the Martin-Hopkins  calculation, which is a validated novel method providing  better accuracy than the Friedewald equation in the  estimation of LDL-C.  Cresenciano Genre et al. Annamaria Helling. 1660;630(16): 2061-2068  (http://education.[facility].com/faq/FAQ164)    Total CHOL/HDL Ratio 6.0 (H) <5.0 (calc)   Non-HDL Cholesterol (Calc) 155 (H) <130 mg/dL (calc)    Comment: For patients with diabetes plus 1 major ASCVD risk  factor, treating to a non-HDL-C goal of <100 mg/dL  (LDL-C of <70 mg/dL) is considered a therapeutic  option.   TSH     Status: None   Collection  Time: 12/05/19  2:48 PM  Result Value Ref Range   TSH 1.49 0.40 - 4.50 mIU/L   No results found.       Assessment and Plan: 64 y.o. female with The primary encounter diagnosis was Other hyperlipidemia. Diagnoses of Essential hypertension, Depression, unspecified depression type, Postmenopause, Hypertriglyceridemia, Elevated fasting glucose, Bone disorder, and History of colon polyps were also pertinent to this visit.  Need Shingrix #2, PPSV23  Need colon cancer screening Need DEXA Recommended COVID vaccine when able  Repeat labs in 3 mos, if still high TG/high A1C will discuss Rx change    PDMP not reviewed this encounter. Orders Placed This Encounter  Procedures  . DG BONE DENSITY (DXA)    Order Specific Question:   Reason for Exam (SYMPTOM  OR DIAGNOSIS REQUIRED)    Answer:   screen    Order Specific Question:   Preferred imaging location?    Answer:   [facility]  . Lipid panel  . Hemoglobin A1c  . COMPLETE METABOLIC PANEL WITH GFR  . Ambulatory referral to Gastroenterology    Referral Priority:   Routine    Referral Type:   Consultation    Referral Reason:   Specialty Services Required    Number of Visits Requested:   1   Meds ordered this encounter  Medications  . losartan (COZAAR) 100 MG tablet    Sig: Take 1 tablet (100 mg total) by mouth daily.    Dispense:  90 tablet    Refill:  3  . buPROPion (WELLBUTRIN XL) 150 MG 24 hr tablet    Sig: Take 1 tablet (150 mg total) by mouth daily.    Dispense:  90 tablet    Refill:  3  . cetirizine (ZYRTEC) 10 MG tablet    Sig: Take 1 tablet (10 mg total) by mouth 2 (two) times daily.    Dispense:  180 tablet    Refill:  3  . metoprolol succinate (TOPROL-XL) 25 MG 24 hr tablet    Sig: Take 1 tablet (25 mg total) by mouth daily.    Dispense:  90 tablet    Refill:  3    No refills. Overdue for a f/u w/PCP.  Marland Kitchen venlafaxine XR (EFFEXOR-XR) 75 MG 24 hr capsule    Sig: Take 1 capsule (75 mg  total) by mouth daily  with breakfast.    Dispense:  90 capsule    Refill:  3   Patient Instructions   General Preventive Care  Most recent routine screening lipids/other labs: done!  Cholesterol:   Diabetes screening:   Everyone should have blood pressure checked once per year.   Tobacco: don't!   Alcohol: responsible moderation is ok for most adults - if you have concerns about your alcohol intake, please talk to me!   Exercise: as tolerated to reduce risk of cardiovascular disease and diabetes. Strength training will also prevent osteoporosis.   Mental health: if need for mental health care (medicines, counseling, other), or concerns about moods, please let me know!   Sexual health: if need for STD testing, or if concerns with libido/pain problems, please let me know!   Advanced Directive: Living Will and/or Healthcare Power of Attorney recommended for all adults, regardless of age or health.  Vaccines  Flu vaccine: recommended for almost everyone, every fall.   Shingles vaccine: Shingrix recommended after age 63. We have one shot on file for you! DId you get the second one?   Pneumonia vaccines: Pneumovax recommended at age 34  Tetanus booster: Tdap recommended every 10 years. Due 2024 Immunization History  Administered Date(s) Administered  . Influenza, High Dose Seasonal PF 01/19/2019, 11/11/2019  . Influenza,inj,Quad PF,6+ Mos 07/09/2017  . Influenza-Unspecified 11/01/2007, 10/11/2015  . Td 11/03/1998  . Tdap 11/18/2012  . Zoster 05/23/2014  . Zoster Recombinat (Shingrix) 07/09/2017    Cancer screenings   Colon cancer screening: due 08/2019  Breast cancer screening: mammogram normal 11/2019, due for repeat 11/2020  Cervical cancer screening: Pap every 1 to 5 years depending on age and other risk factors. Can usually stop at age 31 or w/ hysterectomy.   Lung cancer screening: not needed for nonsmokers  Infection screenings . HIV, Gonorrhea/Chlamydia: screening as  needed . Hepatitis C: recommended for anyone born 70-1967 . TB: certain at-risk populations, or depending on work requirements and/or travel history Other . Bone Density Test: recommended for women at age 32    Instructions sent via MyChart. If MyChart not available, pt was given option for info via personal e-mail w/ no guarantee of protected health info over unsecured e-mail communication, and MyChart sign-up instructions were sent to patient.   Follow Up Instructions: Return in about 3 months (around 03/05/2020) for lab visit.    I discussed the assessment and treatment plan with the patient. The patient was provided an opportunity to ask questions and all were answered. The patient agreed with the plan and demonstrated an understanding of the instructions.   The patient was advised to call back or seek an in-person evaluation if any new concerns, if symptoms worsen or if the condition fails to improve as anticipated.  30 minutes of non-face-to-face time was provided during this encounter.      . . . . . . . . . . . . . Marland Kitchen                   Historical information moved to improve visibility of documentation.  Past Medical History:  Diagnosis Date  . Essential hypertension 11/21/2015  . Heart disease    SMALL PROLAPSE  . Hypertension   . MVP (mitral valve prolapse)    Past Surgical History:  Procedure Laterality Date  . BREAST BIOPSY Left    benign   . BREAST SURGERY  2001   BREAST CYST  . CESAREAN  SECTION     Social History   Tobacco Use  . Smoking status: Never Smoker  . Smokeless tobacco: Never Used  Substance Use Topics  . Alcohol use: Yes   family history includes Cancer in her maternal grandfather, mother, and sister; Hyperlipidemia in her father and mother; Hypertension in her father and mother; Stroke in her father.  Medications: Current Outpatient Medications  Medication Sig Dispense Refill  . acetaminophen (TYLENOL) 650 MG  CR tablet Take 650 mg by mouth every 8 (eight) hours as needed.    . Ascorbic Acid (VITAMIN C) 500 MG CAPS Take by mouth 2 (two) times daily.    Marland Kitchen buPROPion (WELLBUTRIN XL) 150 MG 24 hr tablet Take 1 tablet (150 mg total) by mouth daily. 90 tablet 3  . calcium carbonate (OS-CAL) 600 MG TABS tablet Take 600 mg by mouth 2 (two) times daily with a meal.    . cetirizine (ZYRTEC) 10 MG tablet Take 1 tablet (10 mg total) by mouth 2 (two) times daily. 180 tablet 3  . Cholecalciferol (VITAMIN D) 50 MCG (2000 UT) CAPS Take by mouth 2 (two) times daily.    . COLLAGEN PO Take by mouth.    . diclofenac sodium (VOLTAREN) 1 % GEL Apply topically 4 (four) times daily.    . Lactobacillus (PROBIOTIC ACIDOPHILUS PO) Take by mouth.    . losartan (COZAAR) 100 MG tablet Take 1 tablet (100 mg total) by mouth daily. 90 tablet 3  . metoprolol succinate (TOPROL-XL) 25 MG 24 hr tablet Take 1 tablet (25 mg total) by mouth daily. 90 tablet 3  . Multiple Vitamin (MULTIVITAMIN) tablet Take 1 tablet by mouth daily.    Marland Kitchen venlafaxine XR (EFFEXOR-XR) 75 MG 24 hr capsule Take 1 capsule (75 mg total) by mouth daily with breakfast. 90 capsule 3  . Zinc 50 MG CAPS Take by mouth.     No current facility-administered medications for this visit.   No Known Allergies

## 2019-12-12 ENCOUNTER — Telehealth: Payer: Self-pay | Admitting: Gastroenterology

## 2019-12-12 NOTE — Telephone Encounter (Signed)
Dr. Chales Abrahams, this pt was referred for a colonoscopy.  Her previous colonoscopy records from [facility] are in [facility] for review.  Please advise scheduling.

## 2019-12-13 NOTE — Telephone Encounter (Signed)
Colonoscopy report reviewed in care everywhere 07/2014 ([facility])-small polyps s/p polypectomy.  Also family history of colon cancer in a second-degree relative.  Recommended to repeat in 3 years.  Plan: -Proceed with colonoscopy.  RG

## 2019-12-14 NOTE — Telephone Encounter (Signed)
A message was left for patient to call back to schedule.  

## 2020-01-06 ENCOUNTER — Encounter: Payer: Self-pay | Admitting: Osteopathic Medicine

## 2020-01-27 ENCOUNTER — Ambulatory Visit: Payer: Medicare Other

## 2020-02-13 ENCOUNTER — Encounter: Payer: Self-pay | Admitting: Osteopathic Medicine

## 2020-02-14 NOTE — Telephone Encounter (Signed)
Routing to provider  

## 2020-02-22 ENCOUNTER — Encounter: Payer: Self-pay | Admitting: Osteopathic Medicine

## 2020-04-10 ENCOUNTER — Encounter: Payer: Self-pay | Admitting: Osteopathic Medicine

## 2020-05-27 ENCOUNTER — Encounter: Payer: Self-pay | Admitting: Osteopathic Medicine

## 2020-06-17 ENCOUNTER — Other Ambulatory Visit: Payer: Self-pay | Admitting: Osteopathic Medicine

## 2020-06-17 DIAGNOSIS — F32A Depression, unspecified: Secondary | ICD-10-CM

## 2020-06-17 DIAGNOSIS — F329 Major depressive disorder, single episode, unspecified: Secondary | ICD-10-CM

## 2020-07-26 ENCOUNTER — Encounter: Payer: Self-pay | Admitting: Osteopathic Medicine

## 2020-07-27 ENCOUNTER — Encounter: Payer: Self-pay | Admitting: Osteopathic Medicine

## 2020-07-30 ENCOUNTER — Encounter: Payer: Self-pay | Admitting: Osteopathic Medicine

## 2020-07-30 NOTE — Telephone Encounter (Signed)
Routing to covering provider as an Financial planner. Pls review previous tel encounter for additional information.

## 2020-11-01 ENCOUNTER — Telehealth: Payer: Self-pay

## 2020-11-01 ENCOUNTER — Other Ambulatory Visit: Payer: Self-pay

## 2020-11-01 ENCOUNTER — Encounter: Payer: Self-pay | Admitting: Osteopathic Medicine

## 2020-11-01 ENCOUNTER — Ambulatory Visit (INDEPENDENT_AMBULATORY_CARE_PROVIDER_SITE_OTHER): Payer: Medicare Other | Admitting: Osteopathic Medicine

## 2020-11-01 VITALS — BP 105/71 | HR 99 | Temp 98.4°F | Wt 256.2 lb

## 2020-11-01 DIAGNOSIS — R404 Transient alteration of awareness: Secondary | ICD-10-CM | POA: Diagnosis not present

## 2020-11-01 DIAGNOSIS — F32A Depression, unspecified: Secondary | ICD-10-CM

## 2020-11-01 DIAGNOSIS — G459 Transient cerebral ischemic attack, unspecified: Secondary | ICD-10-CM

## 2020-11-01 DIAGNOSIS — E559 Vitamin D deficiency, unspecified: Secondary | ICD-10-CM | POA: Diagnosis not present

## 2020-11-01 DIAGNOSIS — Z20822 Contact with and (suspected) exposure to covid-19: Secondary | ICD-10-CM

## 2020-11-01 MED ORDER — VENLAFAXINE HCL ER 37.5 MG PO CP24
37.5000 mg | ORAL_CAPSULE | Freq: Every day | ORAL | 0 refills | Status: DC
Start: 2020-11-01 — End: 2020-12-11

## 2020-11-01 MED ORDER — ATORVASTATIN CALCIUM 40 MG PO TABS: 40.0000 mg | ORAL_TABLET | Freq: Every day | ORAL | 3 refills | Status: DC

## 2020-11-01 NOTE — Progress Notes (Signed)
Pamela Newman is a 64 y.o. female who presents to  [facility] at [facility]  today, 11/01/20, seeking care for the following:  Memory loss / confusion  Episode 2 days ago during which she notes memory lapses and confusion on questioning from her husband. This happened 10/30/20 approx 3:00 PM - 8:00 PM.   She was home alone until 7:00 when her husband got home and woke her up from a nap.    She seemed confused to her husband, who started asking orientation questions, questions about things like what she got for recent Christmas gifts few days prior and she couldn't remember but she could remember when asked the next day. He also assessed extremity strength and pupils which seemed symmetrical to him, he notes no slurring speech or word finding difficulty, no facial droop. He asked her to name objects which was WNL. He asked her to remember 3 numbers which she could for a time but then only would get 1 right when asked a couple more times throughout the evening.   Patient knows she took a shower and put away dishes and christmas decorations but cannot remember actually doing those things.   Woke up next morning feeling and acting totally normal   Other info - she is taking Ivermectin 48 mg every 72 hours and was also started on Colchicine 0.6 mg daily from her COVID doctor. She is on multiple supplements: melatonin, niacin, K2, potassium, n-acetyl-cysteine, vitamin C, Vitamin D, probiotics, colagen, multivitamin, zinc. She started colchicine week before Thanksgiving but stopped this yesterday.   Neuro exam today WNL, cardiac/resp exam WNL.  Husband accompanies her today      ASSESSMENT & PLAN with other pertinent findings:  The primary encounter diagnosis was Transient ischemic attack (TIA) vs Transient Amnesia . Diagnoses of Transient alteration of awareness, Vitamin D deficiency, Depression, unspecified depression type, and Encounter for screening  laboratory testing for COVID-19 virus in asymptomatic patient were also pertinent to this visit.   No results found for this or any previous visit (from the past 24 hour(s)).  BP Readings from Last 3 Encounters:  11/01/20 105/71  12/07/19 117/70  10/01/17 107/72     Patient Instructions   ABCD2 Score for TIA: Age ? 60 years: Yes +1 BP ? 140/90 mmHg: no 0 Features of the TIA Unilateral weakness +2 Speech disturbance without weakness +1 Other symptoms 0 Duration of symptoms <10 minutes: 0 10-59 minutes: +1 ? 60 minutes: +2 History of diabetes No:0 Yes: +1 Your score: 3 points Per the validation study, 0-3 points: Low Risk 2-Day Stroke Risk: 1.0% 7-Day Stroke Risk: 1.2% 90-Day Stroke Risk: 3.1%    Next Steps: Neurology consultation MRI brain, imaging of the blood vessels of neck/brian (MRA) Labs testing today Medications for prevention: for patients with ABCD2 score <4, Aspirin 325 mg daily alone is fine for anti-platelet therapy, heavy-duty blood thinners are not needed. Will add Statin medication for additional prevention of stroke.       Orders Placed This Encounter  Procedures  . SARS CoV2 Serology(COVID19) AB(IgG,IgM),Immunoassay  . MR Brain W Wo Contrast  . MR Angiogram Head Wo Contrast  . MR Angiogram Neck W Wo Contrast  . CBC  . COMPLETE METABOLIC PANEL WITH GFR  . Lipid panel  . TSH  . VITAMIN D 25 Hydroxy (Vit-D Deficiency, Fractures)  . Urinalysis with Culture Reflex  . Ambulatory referral to Neurology    Meds ordered this encounter  Medications  .  atorvastatin (LIPITOR) 40 MG tablet    Sig: Take 1 tablet (40 mg total) by mouth daily.    Dispense:  90 tablet    Refill:  3  . venlafaxine XR (EFFEXOR-XR) 37.5 MG 24 hr capsule    Sig: Take 1 capsule (37.5 mg total) by mouth daily with breakfast. Take for 2-4 weeks then stop to come off this medicine)    Dispense:  30 capsule    Refill:  0       Follow-up instructions: Return for RECHECK  PENDING RESULTS / IF WORSE OR CHANGE.                                         BP 105/71   Pulse 99   Temp 98.4 F (36.9 C)   Wt 256 lb 3.2 oz (116.2 kg)   SpO2 95%   BMI 42.63 kg/m   Current Meds  Medication Sig  . acetaminophen (TYLENOL) 650 MG CR tablet Take 650 mg by mouth every 8 (eight) hours as needed.  . Ascorbic Acid (VITAMIN C) 500 MG CAPS Take by mouth 2 (two) times daily.  Marland Kitchen atorvastatin (LIPITOR) 40 MG tablet Take 1 tablet (40 mg total) by mouth daily.  Marland Kitchen buPROPion (WELLBUTRIN XL) 150 MG 24 hr tablet Take 1 tablet (150 mg total) by mouth daily.  . calcium carbonate (OS-CAL) 600 MG TABS tablet Take 600 mg by mouth 2 (two) times daily with a meal.  . cetirizine (ZYRTEC) 10 MG tablet Take 1 tablet (10 mg total) by mouth 2 (two) times daily.  . Cholecalciferol (VITAMIN D) 50 MCG (2000 UT) CAPS Take by mouth 2 (two) times daily.  . COLLAGEN PO Take by mouth.  . diclofenac sodium (VOLTAREN) 1 % GEL Apply topically 4 (four) times daily.  . Lactobacillus (PROBIOTIC ACIDOPHILUS PO) Take by mouth.  . losartan (COZAAR) 100 MG tablet Take 1 tablet (100 mg total) by mouth daily.  . metoprolol succinate (TOPROL-XL) 25 MG 24 hr tablet Take 1 tablet (25 mg total) by mouth daily.  . Multiple Vitamin (MULTIVITAMIN) tablet Take 1 tablet by mouth daily.  . Zinc 50 MG CAPS Take by mouth.  . [DISCONTINUED] venlafaxine XR (EFFEXOR-XR) 75 MG 24 hr capsule Take 1 capsule (75 mg total) by mouth daily with breakfast.    No results found for this or any previous visit (from the past 72 hour(s)).  No results found.     All questions at time of visit were answered - patient instructed to contact office with any additional concerns or updates.  ER/RTC precautions were reviewed with the patient as applicable.   Please note: voice recognition software and manual typing was used to produce this document, and typos may escape review. Please contact Dr.  Lyn Hollingshead for any needed clarifications.     Total time spent in this in-person encounter was 40 minutes performing my duty and my job as a physician: record review, history-taking and assessment of patient, counseling patient and ensuring understanding of plan, coordinating care which may include but is not limited to further testing or referral. Any modifiers needed may be added by billing department as necessary if I have lapsed in doing so.

## 2020-11-01 NOTE — Patient Instructions (Addendum)
°  ABCD2 Score for TIA: Age ? 60 years: Yes +1 BP ? 140/90 mmHg: no 0 Features of the TIA Unilateral weakness +2 Speech disturbance without weakness +1 Other symptoms 0 Duration of symptoms <10 minutes: 0 10-59 minutes: +1 ? 60 minutes: +2 History of diabetes No:0 Yes: +1 Your score: 3 points Per the validation study, 0-3 points: Low Risk 2-Day Stroke Risk: 1.0% 7-Day Stroke Risk: 1.2% 90-Day Stroke Risk: 3.1%    Next Steps: Neurology consultation MRI brain, imaging of the blood vessels of neck/brian (MRA) Labs testing today Medications for prevention: for patients with ABCD2 score <4, Aspirin 325 mg daily alone is fine for anti-platelet therapy, heavy-duty blood thinners are not needed. Will add Statin medication for additional prevention of stroke.

## 2020-11-01 NOTE — Telephone Encounter (Signed)
Pt called requesting if provider can add additional testing to her labs. Per pt's request, Covid AB testing has been added via [facility] (Req #: [id number]). Pt has been updated. No other inquiries during the call.

## 2020-11-06 LAB — COMPLETE METABOLIC PANEL WITH GFR
AG Ratio: 2 (calc) (ref 1.0–2.5)
ALT: 67 U/L — ABNORMAL HIGH (ref 6–29)
AST: 45 U/L — ABNORMAL HIGH (ref 10–35)
Albumin: 4.4 g/dL (ref 3.6–5.1)
Alkaline phosphatase (APISO): 58 U/L (ref 37–153)
BUN/Creatinine Ratio: 12 (calc) (ref 6–22)
BUN: 13 mg/dL (ref 7–25)
CO2: 26 mmol/L (ref 20–32)
Calcium: 9.9 mg/dL (ref 8.6–10.4)
Chloride: 108 mmol/L (ref 98–110)
Creat: 1.12 mg/dL — ABNORMAL HIGH (ref 0.50–0.99)
GFR, Est African American: 59 mL/min/{1.73_m2} — ABNORMAL LOW (ref >=60)
GFR, Est Non African American: 51 mL/min/{1.73_m2} — ABNORMAL LOW (ref >=60)
Globulin: 2.2 g/dL (calc) (ref 1.9–3.7)
Glucose, Bld: 103 mg/dL — ABNORMAL HIGH (ref 65–99)
Potassium: 4.5 mmol/L (ref 3.5–5.3)
Sodium: 142 mmol/L (ref 135–146)
Total Bilirubin: 0.5 mg/dL (ref 0.2–1.2)
Total Protein: 6.6 g/dL (ref 6.1–8.1)

## 2020-11-06 LAB — URINE CULTURE
MICRO NUMBER:: 11372231
SPECIMEN QUALITY:: ADEQUATE

## 2020-11-06 LAB — CBC
HCT: 41.5 % (ref 35.0–45.0)
Hemoglobin: 14.1 g/dL (ref 11.7–15.5)
MCH: 30.5 pg (ref 27.0–33.0)
MCHC: 34 g/dL (ref 32.0–36.0)
MCV: 89.6 fL (ref 80.0–100.0)
MPV: 11.4 fL (ref 7.5–12.5)
Platelets: 202 10*3/uL (ref 140–400)
RBC: 4.63 10*6/uL (ref 3.80–5.10)
RDW: 14.8 % (ref 11.0–15.0)
WBC: 5.2 10*3/uL (ref 3.8–10.8)

## 2020-11-06 LAB — URINALYSIS W MICROSCOPIC + REFLEX CULTURE
Bilirubin Urine: NEGATIVE
Glucose, UA: NEGATIVE
Hgb urine dipstick: NEGATIVE
Ketones, ur: NEGATIVE
Nitrites, Initial: NEGATIVE
Protein, ur: NEGATIVE
Specific Gravity, Urine: 1.019 (ref 1.001–1.03)
pH: 5.5 (ref 5.0–8.0)

## 2020-11-06 LAB — LIPID PANEL
Cholesterol: 211 mg/dL — ABNORMAL HIGH (ref <200)
HDL: 39 mg/dL — ABNORMAL LOW (ref >=50)
LDL Cholesterol (Calc): 135 mg/dL (calc) — ABNORMAL HIGH
Non-HDL Cholesterol (Calc): 172 mg/dL (calc) — ABNORMAL HIGH (ref <130)
Total CHOL/HDL Ratio: 5.4 (calc) — ABNORMAL HIGH (ref <5.0)
Triglycerides: 229 mg/dL — ABNORMAL HIGH (ref <150)

## 2020-11-06 LAB — SARS COV-2 SEROLOGY(COVID-19)AB(IGG,IGM),IMMUNOASSAY
SARS CoV-2 AB IgG: NEGATIVE
SARS CoV-2 IgM: NEGATIVE

## 2020-11-06 LAB — CULTURE INDICATED

## 2020-11-06 LAB — TEST AUTHORIZATION

## 2020-11-06 LAB — VITAMIN D 25 HYDROXY (VIT D DEFICIENCY, FRACTURES): Vit D, 25-Hydroxy: 87 ng/mL (ref 30–100)

## 2020-11-06 LAB — TSH: TSH: 1.46 mIU/L (ref 0.40–4.50)

## 2020-11-07 ENCOUNTER — Other Ambulatory Visit: Payer: Self-pay

## 2020-11-07 ENCOUNTER — Ambulatory Visit (INDEPENDENT_AMBULATORY_CARE_PROVIDER_SITE_OTHER): Payer: Medicare Other | Admitting: Medical-Surgical

## 2020-11-07 ENCOUNTER — Encounter: Payer: Self-pay | Admitting: Medical-Surgical

## 2020-11-07 VITALS — BP 130/72 | HR 91 | Temp 97.6°F | Ht 65.0 in | Wt 255.6 lb

## 2020-11-07 DIAGNOSIS — R002 Palpitations: Secondary | ICD-10-CM

## 2020-11-07 NOTE — Progress Notes (Signed)
Subjective:    CC: follow up from 12/30 to discuss palpitations/get EKG  HPI: Pleasant 65 year old female presenting today to discuss palpitations. Had an appointment on 12/30 with her PCP for possible TIA and has upcoming imaging. Did not mention it at her appointment but has been having occasional palpitations recently. Was originally evaluated for heart flutters about 8-10 years ago. An echo and heart monitor were completed and she notes she was told that her valve was occasionally not closing completely but is not sure exactly what the diagnosis was.  She was started on Toprol-XL and has been taking this as prescribed, tolerating well without side effects.  Over the past few months she has noticed that she is having occasional heart flutters, approximately 1-2 times weekly that are very short-lived and only last a few seconds.  After her appointment, she spoke with a relative who is in cardiology and was advised at that time that she should notify her PCP.  We did not have an EKG on file so when she messaged via MyChart, she was advised to come in for discussion and completion of an EKG.  Denies chest pain, shortness of breath, palpitations during the appointment, headaches, dizziness, and vision changes.  I reviewed the past medical history, family history, social history, surgical history, and allergies today and no changes were needed.  Please see the problem list section below in epic for further details.  Past Medical History: Past Medical History:  Diagnosis Date  . Essential hypertension 11/21/2015  . Heart disease    SMALL PROLAPSE  . Hypertension   . MVP (mitral valve prolapse)    Past Surgical History: Past Surgical History:  Procedure Laterality Date  . BREAST BIOPSY Left    benign   . BREAST SURGERY  2001   BREAST CYST  . CESAREAN SECTION     Social History: Social History   Socioeconomic History  . Marital status: Married    Spouse name: Not on file  . Number of  children: Not on file  . Years of education: Not on file  . Highest education level: Not on file  Occupational History  . Not on file  Tobacco Use  . Smoking status: Never Smoker  . Smokeless tobacco: Never Used  Substance and Sexual Activity  . Alcohol use: Yes  . Drug use: No  . Sexual activity: Not on file  Other Topics Concern  . Not on file  Social History Narrative  . Not on file   Social Determinants of Health   Financial Resource Strain: Not on file  Food Insecurity: Not on file  Transportation Needs: Not on file  Physical Activity: Not on file  Stress: Not on file  Social Connections: Not on file   Family History: Family History  Problem Relation Age of Onset  . Cancer Mother        BREAST  . Hypertension Mother   . Hyperlipidemia Mother   . Hypertension Father   . Hyperlipidemia Father   . Stroke Father   . Cancer Sister        LUNG  . Cancer Maternal Grandfather        COLON   Allergies: No Known Allergies Medications: See med rec.  Review of Systems: See HPI for pertinent positives and negatives.   Objective:    General: Well Developed, well nourished, and in no acute distress.  Neuro: Alert and oriented x3.  HEENT: Normocephalic, atraumatic.  Skin: Warm and dry. Cardiac: Regular rate and  rhythm, no murmurs rubs or gallops, no lower extremity edema.  Respiratory: Clear to auscultation bilaterally. Not using accessory muscles, speaking in full sentences.  Impression and Recommendations:    1. Palpitations Twelve-lead EKG showing normal sinus rhythm with rate of 96, normal axis.  Reviewed lab results with patient and answered all questions.  Reviewed chart for information from previous provider 8-10 years ago but none was found.  Plan to continue with imaging as scheduled over the weekend.  Note sent to Dr. Lyn Hollingshead for her review, deferring to PCP to evaluate need for further work-up or medication changes. - EKG 12-Lead  Return if symptoms  worsen or fail to improve. ___________________________________________ Thayer Ohm, DNP, APRN, FNP-BC Primary Care and Sports Medicine [facility]

## 2020-11-08 ENCOUNTER — Telehealth: Payer: Self-pay | Admitting: Osteopathic Medicine

## 2020-11-08 ENCOUNTER — Other Ambulatory Visit: Payer: Self-pay | Admitting: Nurse Practitioner

## 2020-11-08 DIAGNOSIS — F418 Other specified anxiety disorders: Secondary | ICD-10-CM

## 2020-11-08 MED ORDER — LORAZEPAM 0.5 MG PO TABS: ORAL_TABLET | ORAL | 0 refills | Status: DC

## 2020-11-08 NOTE — Telephone Encounter (Signed)
Pt called. She has an MRI on Saturday and needs meds to relax her.  Thank you.

## 2020-11-08 NOTE — Progress Notes (Signed)
Prescription for 0.5mg  ativan provided for patient for upcoming MRI scheduled for Saturday.

## 2020-11-08 NOTE — Telephone Encounter (Signed)
0.5mg  Ativan provided for procedure and sent to pharmacy.

## 2020-11-09 NOTE — Telephone Encounter (Signed)
Pt notified of medication and agrees to have a driver.

## 2020-11-10 ENCOUNTER — Encounter (HOSPITAL_BASED_OUTPATIENT_CLINIC_OR_DEPARTMENT_OTHER): Payer: Self-pay

## 2020-11-10 ENCOUNTER — Other Ambulatory Visit: Payer: Self-pay

## 2020-11-10 ENCOUNTER — Ambulatory Visit (HOSPITAL_BASED_OUTPATIENT_CLINIC_OR_DEPARTMENT_OTHER): Admission: RE | Admit: 2020-11-10 | Payer: Medicare Other | Source: Ambulatory Visit

## 2020-11-10 ENCOUNTER — Ambulatory Visit (HOSPITAL_BASED_OUTPATIENT_CLINIC_OR_DEPARTMENT_OTHER)
Admission: RE | Admit: 2020-11-10 | Discharge: 2020-11-10 | Disposition: A | Discharge status: Home / Self Care | Payer: Medicare Other | Source: Ambulatory Visit | Attending: Osteopathic Medicine | Admitting: Osteopathic Medicine

## 2020-11-10 DIAGNOSIS — G459 Transient cerebral ischemic attack, unspecified: Secondary | ICD-10-CM | POA: Insufficient documentation

## 2020-11-10 MED ORDER — GADOBUTROL 1 MMOL/ML IV SOLN
10.0000 mL | Freq: Once | INTRAVENOUS | Status: AC | PRN
  Administered 2020-11-10: 10 mL via INTRAVENOUS

## 2020-11-13 ENCOUNTER — Encounter: Payer: Self-pay | Admitting: Osteopathic Medicine

## 2020-11-16 ENCOUNTER — Encounter: Payer: Self-pay | Admitting: Osteopathic Medicine

## 2020-11-16 NOTE — Telephone Encounter (Signed)
Patient sent duplicate message.

## 2020-11-19 ENCOUNTER — Ambulatory Visit: Payer: Medicare Other | Admitting: Diagnostic Neuroimaging

## 2020-11-24 ENCOUNTER — Other Ambulatory Visit: Payer: Self-pay | Admitting: Osteopathic Medicine

## 2020-11-24 DIAGNOSIS — F32A Depression, unspecified: Secondary | ICD-10-CM

## 2020-12-11 ENCOUNTER — Telehealth: Payer: Self-pay

## 2020-12-11 ENCOUNTER — Other Ambulatory Visit: Payer: Self-pay

## 2020-12-11 ENCOUNTER — Other Ambulatory Visit: Payer: Self-pay | Admitting: Osteopathic Medicine

## 2020-12-11 DIAGNOSIS — F418 Other specified anxiety disorders: Secondary | ICD-10-CM

## 2020-12-11 DIAGNOSIS — F32A Depression, unspecified: Secondary | ICD-10-CM

## 2020-12-11 DIAGNOSIS — I10 Essential (primary) hypertension: Secondary | ICD-10-CM

## 2020-12-11 MED ORDER — VENLAFAXINE HCL ER 37.5 MG PO CP24: 37.5000 mg | ORAL_CAPSULE | Freq: Every day | ORAL | 0 refills | Status: DC

## 2020-12-11 MED ORDER — VENLAFAXINE HCL ER 37.5 MG PO CP24: 37.5000 mg | ORAL_CAPSULE | Freq: Every day | ORAL | 3 refills | Status: DC

## 2020-12-11 NOTE — Telephone Encounter (Signed)
Pt called requesting med refill for effexor xr. Per pt, she did wean herself from the medication, but shortly after she started to have vertigo symptoms. Pt is requesting the lowest dose of effexor. Pls advise, thanks.

## 2020-12-11 NOTE — Telephone Encounter (Signed)
Medication refilled.  Please call patient and let her know that I sent it to pharmacy on file.  Please also make her aware that withdrawal symptoms are extremely common on this medication and vertigo may have been a result of withdrawal.  If she is able to tolerate mild vertigo symptoms, would recommend trying to stay off of the medication for at least 1 to 2 weeks before restarting to see if this improves on its own.  Any further questions will need a visit.  5 minutes spent by self and staff, billed appropriately

## 2020-12-13 NOTE — Telephone Encounter (Signed)
Task completed. Left a detailed vm msg for pt regarding med refill sent to the pharmacy. Informed of provider's recommendation and to schedule an appointment as needed for further inquiries. Direct call back info provided.

## 2020-12-25 ENCOUNTER — Other Ambulatory Visit: Payer: Self-pay | Admitting: Osteopathic Medicine

## 2020-12-25 DIAGNOSIS — I10 Essential (primary) hypertension: Secondary | ICD-10-CM

## 2020-12-25 DIAGNOSIS — F32A Depression, unspecified: Secondary | ICD-10-CM

## 2020-12-25 NOTE — Telephone Encounter (Signed)
Per [facility] pharmacy - losartan backordered/unavailable.

## 2020-12-26 NOTE — Telephone Encounter (Signed)
Left a detailed vm msg for pt regarding losartan rx on backorder. Pt informed to return a call back with new pharmacy or if clinic can send in alternate rx instead. Direct call back info provided.

## 2020-12-26 NOTE — Telephone Encounter (Signed)
Ask patient it's she fine w/ sending to another pharmacy Otherwise can send Valsartan 160 mg daily #90 refill 0 needs BP check w/ nurse in 2 weeks

## 2021-01-09 NOTE — Telephone Encounter (Signed)
Task completed. Pt called today stating she was able to get the Losartan rx. Her pharmacy rec'd a shipment today. Pt will continue with the losartan rx. Pt did not pick up or take the Valsartan rx.

## 2021-02-25 ENCOUNTER — Encounter: Payer: Self-pay | Admitting: Medical-Surgical

## 2021-02-26 ENCOUNTER — Telehealth: Payer: Self-pay | Admitting: Osteopathic Medicine

## 2021-02-26 ENCOUNTER — Other Ambulatory Visit: Payer: Self-pay | Admitting: Medical-Surgical

## 2021-02-26 DIAGNOSIS — R002 Palpitations: Secondary | ICD-10-CM

## 2021-02-26 NOTE — Telephone Encounter (Signed)
This is fine with me. Pending Natalie's reply.

## 2021-02-26 NOTE — Telephone Encounter (Signed)
Patient wanted to know if she can switch care to [facility]. Lack of appointment availability mostly. We have otherwise been happy with the care we have received and are hoping to remain in the care of your office. That was per patient. Please advise.

## 2021-02-28 NOTE — Telephone Encounter (Signed)
Lack of appointments is usually a lack of patients scheduling ahead of time and Joy's schedule will also eventually be the same. Not a valid reason for switch IMO.   Please also make patients aware that MyChart scheduling is limited - always try calling us for appointments

## 2021-03-01 NOTE — Telephone Encounter (Signed)
Left voicemail for patient to call us back.

## 2021-04-22 ENCOUNTER — Ambulatory Visit (INDEPENDENT_AMBULATORY_CARE_PROVIDER_SITE_OTHER): Payer: Medicare Other | Admitting: Osteopathic Medicine

## 2021-04-22 DIAGNOSIS — Z780 Asymptomatic menopausal state: Secondary | ICD-10-CM | POA: Diagnosis not present

## 2021-04-22 DIAGNOSIS — Z0000 Encounter for general adult medical examination without abnormal findings: Secondary | ICD-10-CM

## 2021-04-22 DIAGNOSIS — Z1231 Encounter for screening mammogram for malignant neoplasm of breast: Secondary | ICD-10-CM

## 2021-04-22 NOTE — Progress Notes (Signed)
MEDICARE ANNUAL WELLNESS VISIT  04/22/2021  Telephone Visit Disclaimer This Medicare AWV was conducted by telephone due to national recommendations for restrictions regarding the COVID-19 Pandemic (e.g. social distancing).  I verified, using two identifiers, that I am speaking with Pamela Newman or their authorized healthcare agent. I discussed the limitations, risks, security, and privacy concerns of performing an evaluation and management service by telephone and the potential availability of an in-person appointment in the future. The patient expressed understanding and agreed to proceed.  Location of Patient: Home Location of Provider (nurse):  In the office.  Subjective:    Pamela Newman is a 65 y.o. female patient of Pamela Nielsenlexander, Natalie, Pamela Newman who had a Medicare Annual Wellness Visit today via telephone. Pamela Newman is Retired and lives with their spouse. she has 1 child. she reports that she is socially active and does interact with friends/family regularly. she is minimally physically active and enjoys crafts and refurnish furniture.  Patient Care Team: Pamela Newman, Natalie, Pamela Newman as PCP - General (Osteopathic Medicine)  Advanced Directives 04/22/2021 11/21/2015  Does Patient Have a Medical Advance Directive? Yes Yes  Type of Advance Directive Living will;Healthcare Power of [employer] Corporation[employer] Healthcare Power of [place];Living will  Does patient want to make changes to medical advance directive? No - Patient declined No - Patient declined  Copy of Healthcare Power of Attorney in Chart? No - copy requested Yes    Hospital Utilization Over the Past 12 Months: # of hospitalizations or ER visits: 0 # of surgeries: 0  Review of Systems    Patient reports that her overall health is unchanged compared to last year.  History obtained from chart review and the patient  Patient Reported Readings (BP, Pulse, CBG, Weight, etc) none  Pain Assessment Pain : No/denies pain     Current Medications &  Allergies (verified) Allergies as of 04/22/2021   No Known Allergies      Medication List        Accurate as of April 22, 2021 10:49 AM. If you have any questions, ask your nurse or doctor.          acetaminophen 650 MG CR tablet Commonly known as: TYLENOL Take 650 mg by mouth every 8 (eight) hours as needed.   atorvastatin 40 MG tablet Commonly known as: LIPITOR Take 1 tablet (40 mg total) by mouth daily.   buPROPion 150 MG 24 hr tablet Commonly known as: WELLBUTRIN XL TAKE 1 TABLET BY MOUTH EVERY DAY   calcium carbonate 600 MG Tabs tablet Commonly known as: OS-CAL Take 600 mg by mouth 2 (two) times daily with a meal.   cetirizine 10 MG tablet Commonly known as: ZYRTEC TAKE 1 TABLET BY MOUTH TWICE A DAY   COLLAGEN PO Take by mouth.   diclofenac sodium 1 % Gel Commonly known as: VOLTAREN Apply topically 4 (four) times daily.   dutasteride 0.5 MG capsule Commonly known as: AVODART Take by mouth.   hydroxychloroquine 200 MG tablet Commonly known as: PLAQUENIL Take by mouth.   IVERMECTIN PO 24 mg.   LORazepam 0.5 MG tablet Commonly known as: ATIVAN Take one half (0.25mg ) tab 30 minutes prior to imaging. May take the remaining 0.25mg  if symptoms persist at the time of imaging.   losartan 100 MG tablet Commonly known as: COZAAR TAKE 1 TABLET BY MOUTH EVERY DAY   metoprolol succinate 25 MG 24 hr tablet Commonly known as: TOPROL-XL TAKE 1 TABLET BY MOUTH EVERY DAY   multivitamin tablet Take 1 tablet by  mouth daily.   nitazoxanide 500 MG tablet Commonly known as: ALINIA   PROBIOTIC ACIDOPHILUS PO Take by mouth.   Pulmicort Flexhaler 180 MCG/ACT inhaler Generic drug: budesonide SMARTSIG:2 Puff(s) By Mouth Twice Daily   spironolactone 100 MG tablet Commonly known as: ALDACTONE Take 200 mg by mouth daily.   venlafaxine XR 37.5 MG 24 hr capsule Commonly known as: Effexor XR Take 1 capsule (37.5 mg total) by mouth daily with breakfast.   Vitamin  C 500 MG Caps Take by mouth 2 (two) times daily.   Vitamin D 50 MCG (2000 UT) Caps Take by mouth 2 (two) times daily.   Zinc 50 MG Caps Take by mouth.        History (reviewed): Past Medical History:  Diagnosis Date   Essential hypertension 11/21/2015   Heart disease    SMALL PROLAPSE   Hypertension    MVP (mitral valve prolapse)    Past Surgical History:  Procedure Laterality Date   BREAST BIOPSY Left    benign    BREAST SURGERY  2001   BREAST CYST   CESAREAN SECTION     Family History  Problem Relation Age of Onset   Cancer Mother        BREAST   Hypertension Mother    Hyperlipidemia Mother    Hypertension Father    Hyperlipidemia Father    Stroke Father    Cancer Sister        LUNG   Cancer Maternal Grandfather        COLON   Social History   Socioeconomic History   Marital status: Married    Spouse name: Pamela Newman   Number of children: 1   Years of education: 13   Highest education level: Some college, no degree  Occupational History    Comment: Retired  Tobacco Use   Smoking status: Never   Smokeless tobacco: Never  Substance and Sexual Activity   Alcohol use: Not Currently   Drug use: No   Sexual activity: Not on file  Other Topics Concern   Not on file  Social History Narrative   Likes with her husband. She enjoys doing crafts and refurnishing furniture.   Social Determinants of Health   Financial Resource Strain: Low Risk    Difficulty of Paying Living Expenses: Not hard at all  Food Insecurity: No Food Insecurity   Worried About Programme researcher, broadcasting/film/video in the Last Year: Never true   Ran Out of Food in the Last Year: Never true  Transportation Needs: No Transportation Needs   Lack of Transportation (Medical): No   Lack of Transportation (Non-Medical): No  Physical Activity: Inactive   Days of Exercise per Week: 0 days   Minutes of Exercise per Session: 0 min  Stress: No Stress Concern Present   Feeling of Stress : Not at all  Social  Connections: Moderately Isolated   Frequency of Communication with Friends and Family: Three times a week   Frequency of Social Gatherings with Friends and Family: Twice a week   Attends Religious Services: Never   Database administrator or Organizations: No   Attends Banker Meetings: Never   Marital Status: Married    Activities of Daily Living In your present state of health, Pamela Newman you have any difficulty performing the following activities: 04/22/2021 11/07/2020  Hearing? N N  Vision? N N  Difficulty concentrating or making decisions? N N  Walking or climbing stairs? N N  Dressing or bathing? N  N  Doing errands, shopping? N N  Preparing Food and eating ? N -  Using the Toilet? N -  In the past six months, have you accidently leaked urine? N -  Pamela Newman you have problems with loss of bowel control? N -  Managing your Medications? N -  Managing your Finances? N -  Housekeeping or managing your Housekeeping? N -  Some recent data might be hidden    Patient Education/ Literacy How often Pamela Newman you need to have someone help you when you read instructions, pamphlets, or other written materials from your doctor or pharmacy?: 1 - Never What is the last grade level you completed in school?: 12th grade, some college  Exercise Current Exercise Habits: Home exercise routine, Type of exercise: walking, Time (Minutes): 15, Frequency (Times/Week): >7, Weekly Exercise (Minutes/Week): 0, Intensity: Mild, Exercise limited by: None identified  Diet Patient reports consuming  2-3  meals a day and 2 snack(s) a day Patient reports that her primary diet is: Regular Patient reports that she does have regular access to food.   Depression Screen PHQ 2/9 Scores 04/22/2021 03/01/2019 07/09/2017 06/12/2017  PHQ - 2 Score 0 1 3 0  PHQ- 9 Score - - 11 -     Fall Risk Fall Risk  04/22/2021  Falls in the past year? 0  Number falls in past yr: 0  Injury with Fall? 0  Risk for fall due to : No Fall Risks   Follow up Falls evaluation completed     Objective:  Pamela Newman seemed alert and oriented and she participated appropriately during our telephone visit.  Blood Pressure Weight BMI  BP Readings from Last 3 Encounters:  11/07/20 130/72  11/01/20 105/71  12/07/19 117/70   Wt Readings from Last 3 Encounters:  11/07/20 255 lb 9.6 oz (115.9 kg)  11/01/20 256 lb 3.2 oz (116.2 kg)  12/07/19 245 lb (111.1 kg)   BMI Readings from Last 1 Encounters:  11/07/20 42.53 kg/m    *Unable to obtain current vital signs, weight, and BMI due to telephone visit type  Hearing/Vision  Pamela Newman did not seem to have difficulty with hearing/understanding during the telephone conversation Reports that she has not had a formal eye exam by an eye care professional within the past year Reports that she has not had a formal hearing evaluation within the past year *Unable to fully assess hearing and vision during telephone visit type  Cognitive Function: 6CIT Screen 04/22/2021  What Year? 0 points  What month? 0 points  What time? 0 points  Count back from 20 0 points  Months in reverse 0 points  Repeat phrase 0 points  Total Score 0   (Normal:0-7, Significant for Dysfunction: >8)  Normal Cognitive Function Screening: Yes   Immunization & Health Maintenance Record Immunization History  Administered Date(s) Administered   Influenza, High Dose Seasonal PF 01/19/2019, 11/11/2019   Influenza,inj,Quad PF,6+ Mos 07/09/2017   Influenza-Unspecified 11/01/2007, 10/11/2015, 01/19/2019, 11/11/2019   Td 11/03/1998   Tdap 11/18/2012   Zoster Recombinat (Shingrix) 07/09/2017   Zoster, Live 05/23/2014    Health Maintenance  Topic Date Due   COVID-19 Vaccine (1) 05/08/2021 (Originally 12/22/1960)   Zoster Vaccines- Shingrix (2 of 2) 07/23/2021 (Originally 09/03/2017)   DEXA SCAN  11/07/2021 (Originally 12/22/2018)   COLONOSCOPY (Pts 45-90yrs Insurance coverage will need to be confirmed)  11/07/2021 (Originally  08/04/2019)   PNA vac Low Risk Adult (1 of 2 - PCV13) 11/07/2021 (Originally 12/22/2018)   INFLUENZA VACCINE  06/03/2021   MAMMOGRAM  11/30/2021   TETANUS/TDAP  11/18/2022   Hepatitis C Screening  Completed   HPV VACCINES  Aged Out       Assessment  This is a routine wellness examination for Pamela Newman.  Health Maintenance: Due or Overdue There are no preventive care reminders to display for this patient.   Pamela Newman does not need a referral for Community Assistance: Care Management:   no Social Work:    no Prescription Assistance:  no Nutrition/Diabetes Education:  no   Plan:  Personalized Goals  Goals Addressed               This Visit's Progress     Patient Stated (pt-stated)        04/22/2021 AWV Goal: Exercise for General Health  Patient will verbalize understanding of the benefits of increased physical activity: Exercising regularly is important. It will improve your overall fitness, flexibility, and endurance. Regular exercise also will improve your overall health. It can help you control your weight, reduce stress, and improve your bone density. Over the next year, patient will increase physical activity as tolerated with a goal of at least 150 minutes of moderate physical activity per week.  You can tell that you are exercising at a moderate intensity if your heart starts beating faster and you start breathing faster but can still hold a conversation. Moderate-intensity exercise ideas include: Walking 1 mile (1.6 km) in about 15 minutes Biking Hiking Golfing Dancing Water aerobics Patient will verbalize understanding of everyday activities that increase physical activity by providing examples like the following: Yard work, such as: Insurance underwriter Gardening Washing windows or floors Patient will be able to explain general safety guidelines for exercising:  Before you  start a new exercise program, talk with your health care provider. Pamela Newman not exercise so much that you hurt yourself, feel dizzy, or get very short of breath. Wear comfortable clothes and wear shoes with good support. Drink plenty of water while you exercise to prevent dehydration or heat stroke. Work out until your breathing and your heartbeat get faster.         Personalized Health Maintenance & Screening Recommendations  Pneumococcal vaccine  Screening mammography Bone densitometry screening Eye exam Shingrix vaccine (2nd dose) Colorectal cancer screening (patient has a GI doctor is working on getting it scheduled).  Lung Cancer Screening Recommended: no (Low Dose CT Chest recommended if Age 93-80 years, 30 pack-year currently smoking OR have quit w/in past 15 years) Hepatitis C Screening recommended: no HIV Screening recommended: no  Advanced Directives: Written information was not prepared per patient's request.  Referrals & Orders Orders Placed This Encounter  Procedures   DEXAScan   Mammogram 3D SCREEN BREAST BILATERAL     Follow-up Plan Follow-up with Pamela Nielsen, Pamela Newman as planned Please schedule your 2nd dose of shingrix vaccine and your pneumonia vaccine at the pharmacy. Referral for your mammogram and your bone density scan has been sent and they will call you to schedule. Medicare wellness visit in one year. AVS printed and mailed.   I have personally reviewed and noted the following in the patient's chart:   Medical and social history Use of alcohol, tobacco or illicit drugs  Current medications and supplements Functional ability and status Nutritional status Physical activity Advanced directives List of other physicians Hospitalizations, surgeries, and ER visits in previous 12 months Vitals Screenings to include cognitive,  depression, and falls Referrals and appointments  In addition, I have reviewed and discussed with Pamela Newman certain  preventive protocols, quality metrics, and best practice recommendations. A written personalized care plan for preventive services as well as general preventive health recommendations is available and can be mailed to the patient at her request.      Modesto Charon, RN  04/22/2021

## 2021-04-22 NOTE — Patient Instructions (Signed)
MEDICARE ANNUAL WELLNESS VISIT Health Maintenance Summary and Written Plan of Care  Ms. Negrete ,  Thank you for allowing me to perform your Medicare Annual Wellness Visit and for your ongoing commitment to your health.   Health Maintenance & Immunization History Health Maintenance  Topic Date Due   COVID-19 Vaccine (1) 05/08/2021 (Originally 12/22/1960)   Zoster Vaccines- Shingrix (2 of 2) 07/23/2021 (Originally 09/03/2017)   DEXA SCAN  11/07/2021 (Originally 12/22/2018)   COLONOSCOPY (Pts 45-32yrs Insurance coverage will need to be confirmed)  11/07/2021 (Originally 08/04/2019)   PNA vac Low Risk Adult (1 of 2 - PCV13) 11/07/2021 (Originally 12/22/2018)   INFLUENZA VACCINE  06/03/2021   MAMMOGRAM  11/30/2021   TETANUS/TDAP  11/18/2022   Hepatitis C Screening  Completed   HPV VACCINES  Aged Out   Immunization History  Administered Date(s) Administered   Influenza, High Dose Seasonal PF 01/19/2019, 11/11/2019   Influenza,inj,Quad PF,6+ Mos 07/09/2017   Influenza-Unspecified 11/01/2007, 10/11/2015, 01/19/2019, 11/11/2019   Td 11/03/1998   Tdap 11/18/2012   Zoster Recombinat (Shingrix) 07/09/2017   Zoster, Live 05/23/2014    These are the patient goals that we discussed:  Goals Addressed               This Visit's Progress     Patient Stated (pt-stated)        04/22/2021 AWV Goal: Exercise for General Health  Patient will verbalize understanding of the benefits of increased physical activity: Exercising regularly is important. It will improve your overall fitness, flexibility, and endurance. Regular exercise also will improve your overall health. It can help you control your weight, reduce stress, and improve your bone density. Over the next year, patient will increase physical activity as tolerated with a goal of at least 150 minutes of moderate physical activity per week.  You can tell that you are exercising at a moderate intensity if your heart starts beating faster  and you start breathing faster but can still hold a conversation. Moderate-intensity exercise ideas include: Walking 1 mile (1.6 km) in about 15 minutes Biking Hiking Golfing Dancing Water aerobics Patient will verbalize understanding of everyday activities that increase physical activity by providing examples like the following: Yard work, such as: Insurance underwriter Gardening Washing windows or floors Patient will be able to explain general safety guidelines for exercising:  Before you start a new exercise program, talk with your health care provider. Do not exercise so much that you hurt yourself, feel dizzy, or get very short of breath. Wear comfortable clothes and wear shoes with good support. Drink plenty of water while you exercise to prevent dehydration or heat stroke. Work out until your breathing and your heartbeat get faster.           This is a list of Health Maintenance Items that are overdue or due now: There are no preventive care reminders to display for this patient.   Orders/Referrals Placed Today: Orders Placed This Encounter  Procedures   DEXAScan    Standing Status:   Future    Standing Expiration Date:   04/22/2022    Scheduling Instructions:     Please call patient to schedule    Order Specific Question:   Reason for exam:    Answer:   Post Menopausal    Order Specific Question:   Preferred imaging location?    Answer:   [facility]   Mammogram 3D SCREEN BREAST  BILATERAL    Standing Status:   Future    Standing Expiration Date:   04/22/2022    Scheduling Instructions:     Please call patient to schedule.    Order Specific Question:   Reason for Exam (SYMPTOM  OR DIAGNOSIS REQUIRED)    Answer:   Breast cancer screening    Order Specific Question:   Preferred imaging location?    Answer:   [facility]   (Contact our referral department at  [phone] if you have not spoken with someone about your referral appointment within the next 5 days)    Follow-up Plan Follow-up with Sunnie Nielsen, DO as planned Please schedule your 2nd dose of shingrix vaccine and your pneumonia vaccine at the pharmacy. Referral for your mammogram and your bone density scan has been sent and they will call you to schedule. Medicare wellness visit in one year. AVS printed and mailed.    Health Maintenance, Female Adopting a healthy lifestyle and getting preventive care are important in promoting health and wellness. Ask your health care provider about: The right schedule for you to have regular tests and exams. Things you can do on your own to prevent diseases and keep yourself healthy. What should I know about diet, weight, and exercise? Eat a healthy diet  Eat a diet that includes plenty of vegetables, fruits, low-fat dairy products, and lean protein. Do not eat a lot of foods that are high in solid fats, added sugars, or sodium.  Maintain a healthy weight Body mass index (BMI) is used to identify weight problems. It estimates body fat based on height and weight. Your health care provider can help determineyour BMI and help you achieve or maintain a healthy weight. Get regular exercise Get regular exercise. This is one of the most important things you can do for your health. Most adults should: Exercise for at least 150 minutes each week. The exercise should increase your heart rate and make you sweat (moderate-intensity exercise). Do strengthening exercises at least twice a week. This is in addition to the moderate-intensity exercise. Spend less time sitting. Even light physical activity can be beneficial. Watch cholesterol and blood lipids Have your blood tested for lipids and cholesterol at 67 years of age, then havethis test every 5 years. Have your cholesterol levels checked more often if: Your lipid or cholesterol levels are  high. You are older than 67 years of age. You are at high risk for heart disease. What should I know about cancer screening? Depending on your health history and family history, you may need to have cancer screening at various ages. This may include screening for: Breast cancer. Cervical cancer. Colorectal cancer. Skin cancer. Lung cancer. What should I know about heart disease, diabetes, and high blood pressure? Blood pressure and heart disease High blood pressure causes heart disease and increases the risk of stroke. This is more likely to develop in people who have high blood pressure readings, are of African descent, or are overweight. Have your blood pressure checked: Every 3-5 years if you are 26-54 years of age. Every year if you are 51 years old or older. Diabetes Have regular diabetes screenings. This checks your fasting blood sugar level. Have the screening done: Once every three years after age 26 if you are at a normal weight and have a low risk for diabetes. More often and at a younger age if you are overweight or have a high risk for diabetes. What should I know about preventing infection?  Hepatitis B If you have a higher risk for hepatitis B, you should be screened for this virus. Talk with your health care provider to find out if you are at risk forhepatitis B infection. Hepatitis C Testing is recommended for: Everyone born from [birth year] through 1967. Anyone with known risk factors for hepatitis C. Sexually transmitted infections (STIs) Get screened for STIs, including gonorrhea and chlamydia, if: You are sexually active and are younger than 67 years of age. You are older than 67 years of age and your health care provider tells you that you are at risk for this type of infection. Your sexual activity has changed since you were last screened, and you are at increased risk for chlamydia or gonorrhea. Ask your health care provider if you are at risk. Ask your health care  provider about whether you are at high risk for HIV. Your health care provider may recommend a prescription medicine to help prevent HIV infection. If you choose to take medicine to prevent HIV, you should first get tested for HIV. You should then be tested every 3 months for as long as you are taking the medicine. Pregnancy If you are about to stop having your period (premenopausal) and you may become pregnant, seek counseling before you get pregnant. Take 400 to 800 micrograms (mcg) of folic acid every day if you become pregnant. Ask for birth control (contraception) if you want to prevent pregnancy. Osteoporosis and menopause Osteoporosis is a disease in which the bones lose minerals and strength with aging. This can result in bone fractures. If you are 9 years old or older, or if you are at risk for osteoporosis and fractures, ask your health care provider if you should: Be screened for bone loss. Take a calcium or vitamin D supplement to lower your risk of fractures. Be given hormone replacement therapy (HRT) to treat symptoms of menopause. Follow these instructions at home: Lifestyle Do not use any products that contain nicotine or tobacco, such as cigarettes, e-cigarettes, and chewing tobacco. If you need help quitting, ask your health care provider. Do not use street drugs. Do not share needles. Ask your health care provider for help if you need support or information about quitting drugs. Alcohol use Do not drink alcohol if: Your health care provider tells you not to drink. You are pregnant, may be pregnant, or are planning to become pregnant. If you drink alcohol: Limit how much you use to 0-1 drink a day. Limit intake if you are breastfeeding. Be aware of how much alcohol is in your drink. In the [place] one drink equals one 12 oz bottle of beer (355 mL), one 5 oz glass of wine (148 mL), or one 1 oz glass of hard liquor (44 mL). General instructions Schedule regular health,  dental, and eye exams. Stay current with your vaccines. Tell your health care provider if: You often feel depressed. You have ever been abused or do not feel safe at home. Summary Adopting a healthy lifestyle and getting preventive care are important in promoting health and wellness. Follow your health care provider's instructions about healthy diet, exercising, and getting tested or screened for diseases. Follow your health care provider's instructions on monitoring your cholesterol and blood pressure. This information is not intended to replace advice given to you by your health care provider. Make sure you discuss any questions you have with your healthcare provider. Document Revised: 10/13/2018 Document Reviewed: 10/13/2018 Elsevier Patient Education  2022 [employer].

## 2021-05-03 ENCOUNTER — Encounter: Payer: Self-pay | Admitting: Medical-Surgical

## 2021-05-03 ENCOUNTER — Ambulatory Visit: Payer: Medicare Other | Admitting: Medical-Surgical

## 2021-05-03 ENCOUNTER — Other Ambulatory Visit: Payer: Self-pay

## 2021-05-03 ENCOUNTER — Other Ambulatory Visit: Payer: Self-pay | Admitting: Medical-Surgical

## 2021-05-03 VITALS — BP 117/75 | HR 120 | Temp 99.3°F | Ht 65.0 in | Wt 257.0 lb

## 2021-05-03 DIAGNOSIS — Z7689 Persons encountering health services in other specified circumstances: Secondary | ICD-10-CM | POA: Diagnosis not present

## 2021-05-03 MED ORDER — WEGOVY 0.25 MG/0.5ML ~~LOC~~ SOAJ: 0.2500 mg | SUBCUTANEOUS | 0 refills | Status: DC

## 2021-05-03 NOTE — Patient Instructions (Addendum)
Look into macros and try to figure out how to increase protein in your diet  The Everything Macro Diet Cookbook  Weight loss tips and tricks:  1.  Make sure to drink at least 64 ounces of water every day. 2.  Avoid alcohol. 3.  Avoid eating within 3 hours of going to bed. 4.  Cut out sugary drinks such as sweet tea, regular sodas, energy drinks, etc. 5.  Make sure you are getting enough sleep every night. 6.  Start making changes by cutting back portion sizes by 1/3. 7.  Keep a food diary to help identify areas for improvement and promote awareness of bad habits. 8.  Increase your activity.  Choose something you like to do that is fun for you so you are more likely to stick with it. 9.  Do not forget your protein! 10.  Measure your neck, upper arms, waist, hips, and thighs and write those measurements down somewhere before you start your weight loss journey.  Changes in your measurements will tell a far more accurate story than the number on a scale. 11.  As you go, pay attention to how your clothes fit! 12.  Weigh yourself as often or as little as you need to.  Some folks do better weighing every day while others do better with once a week. 13.  Do not get discouraged!  Weight loss efforts are meant to be lifestyle changes.  Once you stop a medication (if you are taking 1), your behaviors and habits will determine if you maintain your weight loss or not.  For those on medications:  1.  Take your medication as prescribed every day first thing in the morning. 2.  Common side effects include nausea and constipation.  To combat this, increased your daily water consumption.  Consider adding in a stool softener (available OTC) if needed.  Also increase fiber intake with vegetables and fruits. 3.  If you have any side effects or concerns while taking medication, please do not hesitate to reach out to [place] here at the office. 4.  While on weight loss medications, we do require you to follow-up every  4 weeks with an in office visit.  Refills will not be called in early on controlled substances.  Good luck on your weight loss journey!  Have faith in your self and you will reach your goals!

## 2021-05-03 NOTE — Progress Notes (Signed)
Subjective:    CC: weight loss  HPI: Pleasant 65 year old female presenting today to discuss weight loss treatment. She has had difficulty with losing weight over the years and often gains back any that she loses plus some. She has tried multiple "fad" diets but has been unable to stick with any of them for more than a few weeks. Admits that willpower is a big problem for her and if she has a craving, she gives in rather than considering the ramifications of possible weight gain. She has not tried medications for weight loss before but has talked with a couple of people and seen some advertisements about current options. She would like to review options and find out what would work best for her.   I reviewed the past medical history, family history, social history, surgical history, and allergies today and no changes were needed.  Please see the problem list section below in epic for further details.  Past Medical History: Past Medical History:  Diagnosis Date   Essential hypertension 11/21/2015   Heart disease    SMALL PROLAPSE   Hypertension    MVP (mitral valve prolapse)    Past Surgical History: Past Surgical History:  Procedure Laterality Date   BREAST BIOPSY Left    benign    BREAST SURGERY  2001   BREAST CYST   CESAREAN SECTION     Social History: Social History   Socioeconomic History   Marital status: Married    Spouse name: Roger   Number of children: 1   Years of education: 13   Highest education level: Some college, no degree  Occupational History    Comment: Retired  Tobacco Use   Smoking status: Never   Smokeless tobacco: Never  Substance and Sexual Activity   Alcohol use: Not Currently   Drug use: No   Sexual activity: Not on file  Other Topics Concern   Not on file  Social History Narrative   Likes with her husband. She enjoys doing crafts and refurnishing furniture.   Social Determinants of Health   Financial Resource Strain: Low Risk     Difficulty of Paying Living Expenses: Not hard at all  Food Insecurity: No Food Insecurity   Worried About Programme researcher, broadcasting/film/video in the Last Year: Never true   Ran Out of Food in the Last Year: Never true  Transportation Needs: No Transportation Needs   Lack of Transportation (Medical): No   Lack of Transportation (Non-Medical): No  Physical Activity: Inactive   Days of Exercise per Week: 0 days   Minutes of Exercise per Session: 0 min  Stress: No Stress Concern Present   Feeling of Stress : Not at all  Social Connections: Moderately Isolated   Frequency of Communication with Friends and Family: Three times a week   Frequency of Social Gatherings with Friends and Family: Twice a week   Attends Religious Services: Never   Database administrator or Organizations: No   Attends Engineer, structural: Never   Marital Status: Married   Family History: Family History  Problem Relation Age of Onset   Cancer Mother        BREAST   Hypertension Mother    Hyperlipidemia Mother    Hypertension Father    Hyperlipidemia Father    Stroke Father    Cancer Sister        LUNG   Cancer Maternal Grandfather        COLON   Allergies: No Known  Allergies Medications: See med rec.  Review of Systems: See HPI for pertinent positives and negatives.   Objective:    General: Well Developed, well nourished, and in no acute distress.  Neuro: Alert and oriented x3.  HEENT: Normocephalic, atraumatic.  Skin: Warm and dry. Cardiac: Regular rate and rhythm, no murmurs rubs or gallops, no lower extremity edema.  Respiratory: Clear to auscultation bilaterally. Not using accessory muscles, speaking in full sentences.  Impression and Recommendations:    1. Encounter for weight management 2. Morbid obesity (HCC) Patient is morbidly obese with several comorbidities and would greatly benefit from weight loss. Reviewed available options for medical management in primary care. She is currently on  Wellbutrin so Contrave is not likely going to be of benefit. Discussed phentermine vs. Qsymia. With her HTN well controlled, this may be an option but would have to monitor her BP closely and be vigilant for potential side effects/intolerances. Ultimately, would like to get her on Wegovy or even Saxenda as these have long-term safety data and proven results. Advised patient that insurance will be the limiting factor in our options as weight loss medications are not often covered. After much discussion, sending in [facility] 0.25mg  weekly. Would like to proceed with a prior authorization given the likelihood of metabolic syndrome. If unable to proceed with [facility] or Ozempic, Saxenda would be the next choice.   Return in about 4 weeks (around 05/31/2021) for weight check if Reginal Lutes goes through.  35 minutes of face-to-face time was provided during this encounter. ___________________________________________ Thayer Ohm, DNP, APRN, FNP-BC Primary Care and Sports Medicine [facility]

## 2021-05-06 ENCOUNTER — Encounter: Payer: Self-pay | Admitting: Medical-Surgical

## 2021-05-10 ENCOUNTER — Ambulatory Visit (INDEPENDENT_AMBULATORY_CARE_PROVIDER_SITE_OTHER): Payer: Medicare Other | Admitting: Pharmacist

## 2021-05-10 ENCOUNTER — Other Ambulatory Visit: Payer: Self-pay

## 2021-05-10 DIAGNOSIS — I10 Essential (primary) hypertension: Secondary | ICD-10-CM

## 2021-05-10 DIAGNOSIS — E7849 Other hyperlipidemia: Secondary | ICD-10-CM

## 2021-05-10 DIAGNOSIS — Z7689 Persons encountering health services in other specified circumstances: Secondary | ICD-10-CM

## 2021-05-10 NOTE — Progress Notes (Signed)
Care Management   Pharmacy Note  05/10/2021 Name: Pamela Newman MRN: [id number] DOB: [birth year]  Summary: patient placed onto pharmacist schedule for assistance and medication questions. Addressed patient concerns of cost for weight loss medication, provided education on various medications/names, and collected patient history of previous attempts to obtain medication. Patient reports Reginal Lutes out of stock at various pharmacies, Saxenda $1600, Reginal Lutes ordered at pharmacy & was quoted $6000 and then $1300.  Recommendations/Changes made from today's visit: recommend repeat a1c, consider evaluation for diagnosis related to metabolic syndrome and/or pre-diabetes. Consider ozempic, along with Novonordisk patient assistance if income < $54,360 for single income household or < $73,240 for two income household. For more comprehensive care management, please refer to CCM pharmacy services via [phone] as patient is eligible for this service.  Plan: f/u with provider for ongoing lab/diagnosis workup & trial of ozempic, reengage pharmacist if further assistance is needed  Subjective: Pamela Newman is a 65 y.o. year old female who is a primary care patient of Sunnie Nielsen, DO. The Care Management team was consulted for assistance with care management and care coordination needs.    Engaged with patient by telephone for  medication access  in response to provider referral for pharmacy case management and/or care coordination services.   The patient was given information about Care Management services today including:  Care Management services includes personalized support from designated clinical staff supervised by the patient's primary care provider, including individualized plan of care and coordination with other care providers. 24/7 contact phone numbers for assistance for urgent and routine care needs. The patient may stop case management services at any time by phone call to the office staff.  Patient  agreed to services and consent obtained.  Assessment:  Review of patient status, including review of consultants reports, laboratory and other test data, was performed as part of comprehensive evaluation and provision of chronic care management services.   SDOH (Social Determinants of Health) assessments and interventions performed:    Objective:  Lab Results  Component Value Date   CREATININE 1.12 (H) 11/01/2020   CREATININE 0.93 12/05/2019   CREATININE 0.96 10/01/2017    No results found for: HGBA1C     Component Value Date/Time   CHOL 211 (H) 11/01/2020 1416   TRIG 229 (H) 11/01/2020 1416   HDL 39 (L) 11/01/2020 1416   CHOLHDL 5.4 (H) 11/01/2020 1416   VLDL 26 07/07/2017 1341   LDLCALC 135 (H) 11/01/2020 1416    BP Readings from Last 3 Encounters:  05/03/21 117/75  11/07/20 130/72  11/01/20 105/71    Care Plan  No Known Allergies  Medications Reviewed Today     Reviewed by Thermon Leyland, CMA (Certified Medical Assistant) on 05/03/21 at 1512  Med List Status: <None>   Medication Order Taking? Sig Documenting Provider Last Dose Status Informant  acetaminophen (TYLENOL) 650 MG CR tablet [id number] Yes Take 650 mg by mouth every 8 (eight) hours as needed. [provider] Taking Active   Ascorbic Acid (VITAMIN C) 500 MG CAPS [id number] Yes Take by mouth 2 (two) times daily. [provider] Taking Active   atorvastatin (LIPITOR) 40 MG tablet [id number] Yes Take 1 tablet (40 mg total) by mouth daily. Sunnie Nielsen, DO Taking Active   buPROPion (WELLBUTRIN XL) 150 MG 24 hr tablet [id number] Yes TAKE 1 TABLET BY MOUTH EVERY DAY Sunnie Nielsen, DO Taking Active   calcium carbonate (OS-CAL) 600 MG TABS tablet [id number] Yes Take 600 mg by mouth 2 (two) times  daily with a meal. [provider] Taking Active   cetirizine (ZYRTEC) 10 MG tablet [id number] Yes TAKE 1 TABLET BY MOUTH TWICE A DAY Sunnie Nielsen, DO Taking Active    Cholecalciferol (VITAMIN D) 50 MCG (2000 UT) CAPS [id number] Yes Take by mouth 2 (two) times daily. [provider] Taking Active   COLLAGEN PO [id number] Yes Take by mouth. [provider] Taking Active   diclofenac sodium (VOLTAREN) 1 % GEL [id number] Yes Apply topically 4 (four) times daily. [provider] Taking Active   dutasteride (AVODART) 0.5 MG capsule [id number] Yes Take by mouth. [provider] Taking Active   hydroxychloroquine (PLAQUENIL) 200 MG tablet [id number] Yes Take by mouth. [provider] Taking Active   Ibuprofen-diphenhydrAMINE Cit (IBUPROFEN PM) 200-38 MG TABS [id number] Yes Take by mouth at bedtime as needed. [provider] Taking Active   IVERMECTIN PO [id number] Yes 24 mg. [provider] Taking Active            Med Note [place] Charon   Mon Apr 22, 2021 10:44 AM) Taking 24 mg every 72 hours.  Lactobacillus (PROBIOTIC ACIDOPHILUS PO) [id number] Yes Take by mouth. [provider] Taking Active   LORazepam (ATIVAN) 0.5 MG tablet [id number] Yes Take one half (0.25mg ) tab 30 minutes prior to imaging. May take the remaining 0.25mg  if symptoms persist at the time of imaging. Tollie Eth, NP Taking Active   losartan (COZAAR) 100 MG tablet [id number] Yes TAKE 1 TABLET BY MOUTH EVERY DAY Sunnie Nielsen, DO Taking Active   metoprolol succinate (TOPROL-XL) 25 MG 24 hr tablet [id number] Yes TAKE 1 TABLET BY MOUTH EVERY DAY Sunnie Nielsen, DO Taking Active   Multiple Vitamin (MULTIVITAMIN) tablet [id number] Yes Take 1 tablet by mouth daily. [provider] Taking Active   nitazoxanide Bethann Humble) 500 MG tablet [id number] Yes  [provider] Taking Active   PULMICORT FLEXHALER 180 MCG/ACT inhaler [id number] Yes SMARTSIG:2 Puff(s) By Mouth Twice Daily [provider] Taking Active   spironolactone (ALDACTONE) 100 MG tablet [id number] Yes Take 200 mg by mouth daily. [provider]  Taking Active   venlafaxine XR (EFFEXOR XR) 37.5 MG 24 hr capsule [id number] Yes Take 1 capsule (37.5 mg total) by mouth daily with breakfast. Sunnie Nielsen, DO Taking Active   Zinc 50 MG CAPS [id number] Yes Take by mouth. [provider] Taking Active             Patient Active Problem List   Diagnosis Date Noted   Depression 08/26/2017   Elevated serum creatinine 05/28/2016   Hyperlipidemia 11/21/2015   Postmenopause 11/21/2015   Symptomatic menopausal or female climacteric states 03/26/2010   Essential hypertension 03/22/2010    Conditions to be addressed/monitored:  weight management   Medication Assistance:   Investigation of financial options/cost assistance/medication access in progress  Follow Up:  Patient agrees to Care Plan and Follow-up.  Plan: Follow up with provider re: labwork & diagnostic care  Gabriel Carina

## 2021-05-10 NOTE — Patient Instructions (Addendum)
Ms. Lambrecht,  - Follow up with Joy for next steps on any additional labwork at her discretion - We may consider trying for Ozempic - If your income is < $54,360 for single income household or < $73,240 for two income household, you are eligible for Ozempic cost assistance program which would fully cover the medication through [employer] drug company, the application can be found here: https://www.[employer].com/diabetes-overview/let-us-help/pap.html - Let us know if you need anything in the meantime!  [facility] Clinical Pharmacist

## 2021-05-17 ENCOUNTER — Other Ambulatory Visit: Payer: Self-pay | Admitting: Medical-Surgical

## 2021-05-17 DIAGNOSIS — E7849 Other hyperlipidemia: Secondary | ICD-10-CM

## 2021-05-17 DIAGNOSIS — I10 Essential (primary) hypertension: Secondary | ICD-10-CM

## 2021-05-17 MED ORDER — OZEMPIC (0.25 OR 0.5 MG/DOSE) 2 MG/1.5ML ~~LOC~~ SOPN: 0.2500 mg | PEN_INJECTOR | SUBCUTANEOUS | 0 refills | Status: DC

## 2021-05-20 ENCOUNTER — Ambulatory Visit: Payer: Medicare Other | Admitting: Cardiology

## 2021-06-04 ENCOUNTER — Ambulatory Visit: Payer: Medicare Other | Admitting: Medical-Surgical

## 2021-06-08 ENCOUNTER — Other Ambulatory Visit: Payer: Self-pay | Admitting: Medical-Surgical

## 2021-06-08 DIAGNOSIS — I10 Essential (primary) hypertension: Secondary | ICD-10-CM

## 2021-06-08 DIAGNOSIS — E7849 Other hyperlipidemia: Secondary | ICD-10-CM

## 2021-06-18 ENCOUNTER — Encounter: Payer: Self-pay | Admitting: Medical-Surgical

## 2021-06-18 ENCOUNTER — Ambulatory Visit: Payer: Medicare Other | Admitting: Medical-Surgical

## 2021-06-18 ENCOUNTER — Other Ambulatory Visit: Payer: Self-pay

## 2021-06-18 VITALS — BP 110/72 | HR 90 | Resp 20 | Wt 245.0 lb

## 2021-06-18 DIAGNOSIS — Z7689 Persons encountering health services in other specified circumstances: Secondary | ICD-10-CM

## 2021-06-18 NOTE — Progress Notes (Signed)
  HPI with pertinent ROS:   CC: Weight check  HPI: Very pleasant 65 year old female presenting today for weight check on Ozempic.  She completed 4 weeks of Ozempic 0.25 mg subcu weekly and has done extremely well with it.  Tolerating it without side effects.  Has noticed a significantly decreased appetite and no longer has cravings.  She is not reaching for comfort food on a regular basis anymore.  She is using a food calculator app set at 1600 cal/day and often does not reach her calorie requirement.  She is working on getting more active and she definitely feels more motivation since she is seeing some benefit to her weight loss.  Her activity is limited by bilateral knee pain right greater than left.  She is doing new taping on a regular basis and has bought new shoes.  She does have a pair orthotics but they are not custom made.  She has Voltaren gel that she uses approximately once daily before bed.  Would like to hold off on medications or other treatments today but if pain is still present when she returns for her next visit, she would like to discuss options.  I reviewed the past medical history, family history, social history, surgical history, and allergies today and no changes were needed.  Please see the problem list section below in epic for further details.   Physical exam:   General: Well Developed, well nourished, and in no acute distress.  Neuro: Alert and oriented x3.  HEENT: Normocephalic, atraumatic.  Skin: Warm and dry. Cardiac: Regular rate and rhythm.  Respiratory: Not using accessory muscles, speaking in full sentences.  Impression and Recommendations:    1. Encounter for weight management 12 pounds lost over the past 4 weeks.  She has done an amazing job with dietary modification and behavior changes.  Would recommend increased activity as her knee pain allows.  May need to consider increasing Voltaren to up to 4 times daily.  If new shoes and orthotics are not helpful  with relief of knee pain, she may need custom orthotics and I will be glad to refer her to Dr. Jordan Likes with sports medicine for those.  Continue healthy dietary choices and be mindful of intake.  Increasing Ozempic to 0.5 mg weekly for the next 4 weeks.  Return in about 4 weeks (around 07/16/2021) for weight check. ___________________________________________ Thayer Ohm, DNP, APRN, FNP-BC Primary Care and Sports Medicine [facility]

## 2021-07-10 ENCOUNTER — Other Ambulatory Visit: Payer: Self-pay | Admitting: Medical-Surgical

## 2021-07-10 DIAGNOSIS — E7849 Other hyperlipidemia: Secondary | ICD-10-CM

## 2021-07-10 DIAGNOSIS — I10 Essential (primary) hypertension: Secondary | ICD-10-CM

## 2021-07-16 ENCOUNTER — Ambulatory Visit: Payer: Medicare Other | Admitting: Medical-Surgical

## 2021-07-22 ENCOUNTER — Ambulatory Visit: Payer: Medicare Other | Admitting: Medical-Surgical

## 2021-07-24 ENCOUNTER — Ambulatory Visit: Payer: Medicare Other | Admitting: Medical-Surgical

## 2021-07-26 ENCOUNTER — Ambulatory Visit: Payer: Medicare Other | Admitting: Medical-Surgical

## 2021-07-26 ENCOUNTER — Encounter: Payer: Self-pay | Admitting: Medical-Surgical

## 2021-07-26 VITALS — BP 124/76 | HR 96 | Resp 20 | Ht 65.0 in | Wt 242.6 lb

## 2021-07-26 DIAGNOSIS — Z2821 Immunization not carried out because of patient refusal: Secondary | ICD-10-CM | POA: Diagnosis not present

## 2021-07-26 DIAGNOSIS — L237 Allergic contact dermatitis due to plants, except food: Secondary | ICD-10-CM | POA: Diagnosis not present

## 2021-07-26 DIAGNOSIS — Z7689 Persons encountering health services in other specified circumstances: Secondary | ICD-10-CM

## 2021-07-26 MED ORDER — METHYLPREDNISOLONE ACETATE 80 MG/ML IJ SUSP
80.0000 mg | Freq: Once | INTRAMUSCULAR | Status: AC
  Administered 2021-07-26: 80 mg via INTRAMUSCULAR

## 2021-07-26 MED ORDER — SEMAGLUTIDE (1 MG/DOSE) 4 MG/3ML ~~LOC~~ SOPN: 1.0000 mg | PEN_INJECTOR | SUBCUTANEOUS | 0 refills | Status: DC

## 2021-07-26 NOTE — Patient Instructions (Signed)
Dr. Doristine Locks- GI doctor for colonoscopy

## 2021-07-26 NOTE — Progress Notes (Signed)
  HPI with pertinent ROS:   CC: weight check, rash  HPI: Pleasant 64 year old female presenting today for weight check on Wegovy/Ozempic.  She has been doing 0.5 mg weekly for the last 4 weeks, tolerating well without side effects.  Feels that her appetite is decreased and she is no longer craving the unhealthy foods that she used to.  She did go to the beach and have a good vacation over the last week with some friends.  Notes that her dietary habits did not fare well on vacation but she has returned to regular eating habits now.  She is not currently exercising due to right knee pain.  Does have a rash on her upper extremities as well as on the left side of her abdomen.  Notes that she was in the yard working with some sticks and vines.  Previously very susceptible to poison ivy.  She has been using over-the-counter poison ivy treatment which does seem to have improved the rash but she is still having significant itching at the sites.  I reviewed the past medical history, family history, social history, surgical history, and allergies today and no changes were needed.  Please see the problem list section below in epic for further details.   Physical exam:   General: Well Developed, well nourished, and in no acute distress.  Neuro: Alert and oriented x3.  HEENT: Normocephalic, atraumatic.  Skin: Warm and dry. Cardiac: Regular rate and rhythm.  Respiratory: Not using accessory muscles, speaking in full sentences.  Impression and Recommendations:    1. Encounter for weight management Despite recent indulgence over vacation, she has still lost about 3 pounds.  Increase Ozempic to 1 mg weekly for the next 4 weeks.  Strongly recommend increasing exercise with a focus on strength training to build muscle.  Discussed resources to find exercises that may benefit her without risking worsening her knee pain.  2. Contact dermatitis due to poison ivy Continue over-the-counter remedy.  Giving  Depo-Medrol 80 mg IM x1 in office today to help speed resolution of rash. - methylPREDNISolone acetate (DEPO-MEDROL) injection 80 mg  3. Influenza vaccine refused Patient aware of risk versus benefits of refusal of vaccinations.  Return in about 4 weeks (around 08/23/2021) for weight check (ok to be virtual). ___________________________________________ Thayer Ohm, DNP, APRN, FNP-BC Primary Care and Sports Medicine [facility] [place]

## 2021-08-08 ENCOUNTER — Encounter: Payer: Self-pay | Admitting: Gastroenterology

## 2021-08-22 ENCOUNTER — Telehealth: Payer: Self-pay | Admitting: Medical-Surgical

## 2021-08-22 NOTE — Telephone Encounter (Signed)
Pt called. She will be run out of her Ozempic before her appointment on November 22nd.

## 2021-08-23 ENCOUNTER — Encounter: Payer: Medicare Other | Admitting: Medical-Surgical

## 2021-08-23 ENCOUNTER — Encounter: Payer: Self-pay | Admitting: Medical-Surgical

## 2021-08-23 MED ORDER — SEMAGLUTIDE (1 MG/DOSE) 4 MG/3ML ~~LOC~~ SOPN: 1.0000 mg | PEN_INJECTOR | SUBCUTANEOUS | 0 refills | Status: DC

## 2021-08-23 NOTE — Telephone Encounter (Signed)
Medication sent.  Patient aware  

## 2021-08-23 NOTE — Progress Notes (Signed)
Erroneous encounter. Please disregard.

## 2021-08-28 ENCOUNTER — Ambulatory Visit: Payer: Medicare Other

## 2021-08-28 ENCOUNTER — Other Ambulatory Visit: Payer: Medicare Other

## 2021-09-02 ENCOUNTER — Ambulatory Visit (AMBULATORY_SURGERY_CENTER): Payer: Medicare Other | Admitting: *Deleted

## 2021-09-02 ENCOUNTER — Encounter: Payer: Self-pay | Admitting: Gastroenterology

## 2021-09-02 ENCOUNTER — Other Ambulatory Visit: Payer: Self-pay

## 2021-09-02 VITALS — Ht 65.0 in | Wt 230.0 lb

## 2021-09-02 DIAGNOSIS — Z86010 Personal history of colonic polyps: Secondary | ICD-10-CM

## 2021-09-02 MED ORDER — PLENVU 140 G PO SOLR: 1.0000 | Freq: Once | ORAL | 0 refills | Status: AC

## 2021-09-02 NOTE — Progress Notes (Signed)
Pt's previsit is done over the phone and all paperwork (prep instructions, blank consent form to just read over) sent to patient.  Pt's name and DOB verified at the beginning of the previsit.  Pt denies any difficulty with ambulating.    No trouble with anesthesia, denies trouble moving neck, or hx/fam hx of malignant hyperthermia per pt    No egg or soy allergy  No home oxygen use   No medications for weight loss taken  emmi information given  Pt denies constipation issues  Pt informed that we do not do prior authorizations for prep  Plenvu coupon sent to pt

## 2021-09-04 ENCOUNTER — Ambulatory Visit: Payer: Medicare Other

## 2021-09-04 ENCOUNTER — Other Ambulatory Visit: Payer: Medicare Other

## 2021-09-09 NOTE — Progress Notes (Addendum)
Cardiology Office Note:    Date:  09/23/2021   ID:  Pamela Newman, DOB Mar 03, 1956, MRN [id number]  PCP:  Pamela Butter, NP   [facility] Providers Cardiologist:  None {  Referring MD: Pamela Butter, NP     History of Present Illness:    Pamela Newman is a 64 y.o. female with a hx of HTN, HLD, depression, and obesity on ozempic who was referred by Pamela Butter, NP for further evaluation of possible TIA.  Today, the patient states that in 11/2020 she had an episode where she had a lapse in memory transiently. She could not remember Christmas day. Saw her PCP where MRI showed chronic small vessel ischemic changes but no acute stroke. MRA neck without significant carotid disease. She was placed on medications to help control her blood pressure and has also worked on diet and weight loss and has successfully lost 25lbs. No recurrence of symptoms since that time. Given concern for stroke like symptoms, however, she was referred to Cardiology for further management.  The patient otherwise feels well. No chest pain, SOB, lightheadedness or dizziness. Occasional palpitations. She admits to being less active since COVID but is motivated to start exercising again. Had prior cardiology work-up after having fluctuating blood pressures and palpitations about 10 years ago which was reassuringly normal. Has not had recent cardiology evaluation.   Past Medical History:  Diagnosis Date   Allergy    Anxiety    Arthritis    Asthma    Cataract    Essential hypertension 11/21/2015   Heart disease    SMALL PROLAPSE   Hypertension    Memory loss    january 2022, 10 minutes memory loss, all tests normal and no incidence since   MVP (mitral valve prolapse)     Past Surgical History:  Procedure Laterality Date   BREAST BIOPSY Left    benign    BREAST SURGERY  2001   BREAST CYST   CATARACT EXTRACTION Bilateral    CESAREAN SECTION     COLONOSCOPY      Current Medications: Current Meds  Medication  Sig   acetaminophen (TYLENOL) 650 MG CR tablet Take 650 mg by mouth every 8 (eight) hours as needed.   atorvastatin (LIPITOR) 40 MG tablet Take 1 tablet (40 mg total) by mouth daily.   buPROPion (WELLBUTRIN XL) 150 MG 24 hr tablet TAKE 1 TABLET BY MOUTH EVERY DAY   cetirizine (ZYRTEC) 10 MG tablet TAKE 1 TABLET BY MOUTH TWICE A DAY   losartan (COZAAR) 100 MG tablet TAKE 1 TABLET BY MOUTH EVERY DAY   metoprolol succinate (TOPROL-XL) 25 MG 24 hr tablet TAKE 1 TABLET BY MOUTH EVERY DAY   Semaglutide, 1 MG/DOSE, 4 MG/3ML SOPN Inject 1 mg as directed once a week.     Allergies:   Patient has no known allergies.   Social History   Socioeconomic History   Marital status: Married    Spouse name: Pamela Newman   Number of children: 1   Years of education: 13   Highest education level: Some college, no degree  Occupational History    Comment: Retired  Tobacco Use   Smoking status: Never   Smokeless tobacco: Never  Vaping Use   Vaping Use: Never used  Substance and Sexual Activity   Alcohol use: Not Currently   Drug use: No   Sexual activity: Not on file  Other Topics Concern   Not on file  Social History Narrative   Likes with her husband.  She enjoys doing crafts and refurnishing furniture.   Social Determinants of Health   Financial Resource Strain: Low Risk    Difficulty of Paying Living Expenses: Not hard at all  Food Insecurity: No Food Insecurity   Worried About Programme researcher, broadcasting/film/video in the Last Year: Never true   Ran Out of Food in the Last Year: Never true  Transportation Needs: No Transportation Needs   Lack of Transportation (Medical): No   Lack of Transportation (Non-Medical): No  Physical Activity: Inactive   Days of Exercise per Week: 0 days   Minutes of Exercise per Session: 0 min  Stress: No Stress Concern Present   Feeling of Stress : Not at all  Social Connections: Moderately Isolated   Frequency of Communication with Friends and Family: Three times a week   Frequency  of Social Gatherings with Friends and Family: Twice a week   Attends Religious Services: Never   Database administrator or Organizations: No   Attends Engineer, structural: Never   Marital Status: Married     Family History: The patient's family history includes Cancer in her maternal grandfather, mother, and sister; Colon cancer in her maternal grandfather; Esophageal cancer in her sister; Hyperlipidemia in her father and mother; Hypertension in her father and mother; Stroke in her father.  ROS:   Please see the history of present illness.    Review of Systems  Constitutional:  Negative for chills and fever.  HENT:  Negative for sore throat.   Respiratory:  Negative for shortness of breath.   Cardiovascular:  Positive for palpitations. Negative for chest pain, orthopnea, claudication, leg swelling and PND.  Gastrointestinal:  Negative for nausea and vomiting.  Musculoskeletal:  Negative for falls.  Neurological:  Negative for dizziness, sensory change, speech change, focal weakness, loss of consciousness and weakness.    EKGs/Labs/Other Studies Reviewed:    The following studies were reviewed today: MRI/MRA head and neck 11/2020:   IMPRESSION: 1. No acute intracranial finding. Chronic small-vessel ischemic changes of the dorsal pons. 2. Negative MR angiography of the neck vessels, though the proximal vertebral arteries are not well seen due to motion, particularly on the right. I cannot rule out proximal right vertebral stenosis, but do not strongly suspect that. 3. Intracranial MR angiography of the large and medium size vessels is normal.  CTA Chest/Abd/Pelvis 2018: FINDINGS: CTA CHEST FINDINGS   Cardiovascular: Satisfactory opacification of the pulmonary arteries to the segmental level. No evidence of pulmonary embolism. Normal heart size. No pericardial effusion. There is no evidence of thoracic aortic dissection or aneurysm.   Mediastinum/Nodes: Possible 2  cm right thyroid mass is noted. No adenopathy is noted. The esophagus is unremarkable.   Lungs/Pleura: Lungs are clear. No pleural effusion or pneumothorax.   Musculoskeletal: No chest wall abnormality. No acute or significant osseous findings.   Review of the MIP images confirms the above findings.   CT ABDOMEN and PELVIS FINDINGS   Hepatobiliary: No gallstones are noted. 46 x 28 mm multilobulated cyst is seen superiorly in right hepatic lobe.   Pancreas: Unremarkable. No pancreatic ductal dilatation or surrounding inflammatory changes.   Spleen: Moderate splenomegaly is noted without focal abnormality.   Adrenals/Urinary Tract: Adrenal glands are unremarkable. Kidneys are normal, without renal calculi, focal lesion, or hydronephrosis. Bladder is unremarkable.   Stomach/Bowel: Stomach is within normal limits. Appendix appears normal. No evidence of bowel wall thickening, distention, or inflammatory changes.   Vascular/Lymphatic: No significant vascular findings are present.  No enlarged abdominal or pelvic lymph nodes.   Reproductive: Uterus and bilateral adnexa are unremarkable.   Other: No abdominal wall hernia or abnormality. No abdominopelvic ascites.   Musculoskeletal: No acute or significant osseous findings.   Review of the MIP images confirms the above findings.   IMPRESSION: No definite evidence of pulmonary embolus.   Possible 2 cm right thyroid mass. Thyroid ultrasound is recommended for further evaluation.   4.6 x 2.8 cm probable benign multilobulated cyst seen in right hepatic lobe.   No other abnormality seen in the chest, abdomen or pelvis.  EKG:  EKG is  ordered today.  The ekg ordered today demonstrates NSR with HR 90  Recent Labs: 11/01/2020: ALT 67; BUN 13; Creat 1.12; Hemoglobin 14.1; Platelets 202; Potassium 4.5; Sodium 142; TSH 1.46  Recent Lipid Panel    Component Value Date/Time   CHOL 211 (H) 11/01/2020 1416   TRIG 229 (H) 11/01/2020  1416   HDL 39 (L) 11/01/2020 1416   CHOLHDL 5.4 (H) 11/01/2020 1416   VLDL 26 07/07/2017 1341   LDLCALC 135 (H) 11/01/2020 1416     Risk Assessment/Calculations:           Physical Exam:    VS:  BP 124/70   Pulse 90   Ht 5\' 5"  (1.651 m)   Wt 237 lb (107.5 kg)   SpO2 98%   BMI 39.44 kg/m     Wt Readings from Last 3 Encounters:  09/23/21 237 lb (107.5 kg)  09/16/21 230 lb (104.3 kg)  09/02/21 230 lb (104.3 kg)     GEN:  Well nourished, well developed in no acute distress HEENT: Normal NECK: No JVD; No carotid bruits CARDIAC: RRR, no murmurs, rubs, gallops RESPIRATORY:  Clear to auscultation without rales, wheezing or rhonchi  ABDOMEN: Soft, non-tender, non-distended MUSCULOSKELETAL:  No edema; No deformity  SKIN: Warm and dry NEUROLOGIC:  Alert and oriented x 3 PSYCHIATRIC:  Normal affect   ASSESSMENT:    1. TIA (transient ischemic attack)   2. Other hyperlipidemia   3. Palpitations   4. Encounter for laboratory examination   5. Primary hypertension    PLAN:    In order of problems listed above:  #Concern for TIA: Patient reports episode of transient loss of memory in 11/2020. Had MRI/MRA head with no acute findings. Has not had cardiac work-up. No known history of CAD, HF or arrhythmias. Otherwise feels well with no chest pain, lightheadedness, dizziness or syncope. Fortunately, no recurrence of symptoms since that time. Will check monitor to assess for arrhythmias and TTE to assess for cardiac source of embolus. -Check 2 week zio monitor -Check TTE  #HTN: Well controlled. -Continue losartan 100mg  daily -Continue metop 25mg  XL daily  #HLD: LDL elevated at 135. -Continue atorvastatin 40mg  daily -Continue weight loss efforts -Check coronary calcium score -Check lipid panel  #Obesity: Followed by weight loss clinic. On Ozempic. -Follow-up with weight loss clinic as scheduled -Continue ozempic 1mg  q weekly       Medication Adjustments/Labs and  Tests Ordered: Current medicines are reviewed at length with the patient today.  Concerns regarding medicines are outlined above.  Orders Placed This Encounter  Procedures   CT CARDIAC SCORING (SELF PAY ONLY)   Lipid Profile   HgB A1c   LONG TERM MONITOR (3-14 DAYS)   EKG 12-Lead   ECHOCARDIOGRAM COMPLETE   No orders of the defined types were placed in this encounter.   Patient Instructions  Medication Instructions:   Your  physician recommends that you continue on your current medications as directed. Please refer to the Current Medication list given to you today.  *If you need a refill on your cardiac medications before your next appointment, please call your pharmacy*   Lab Work:  TODAY--LIPIDS AND HEMOGLOBIN A1C  If you have labs (blood work) drawn today and your tests are completely normal, you will receive your results only by: MyChart Message (if you have MyChart) OR A paper copy in the mail If you have any lab test that is abnormal or we need to change your treatment, we will call you to review the results.   Testing/Procedures:  Your physician has requested that you have an echocardiogram. Echocardiography is a painless test that uses sound waves to create images of your heart. It provides your doctor with information about the size and shape of your heart and how well your heart's chambers and valves are working. This procedure takes approximately one hour. There are no restrictions for this procedure.   CARDIAC CALCIUM SCORE DONE HERE IN THE OFFICE--(SELF PAY)   ZIO XT- Long Term Monitor Instructions  Your physician has requested you wear a ZIO patch monitor for 14 days.  This is a single patch monitor. [employer] supplies one patch monitor per enrollment. Additional stickers are not available. Please do not apply patch if you will be having a Nuclear Stress Test,  Echocardiogram, Cardiac CT, MRI, or Chest Xray during the period you would be wearing the  monitor.  The patch cannot be worn during these tests. You cannot remove and re-apply the  ZIO XT patch monitor.  Your ZIO patch monitor will be mailed 3 day USPS to your address on file. It may take 3-5 days  to receive your monitor after you have been enrolled.  Once you have received your monitor, please review the enclosed instructions. Your monitor  has already been registered assigning a specific monitor serial # to you.  Billing and Patient Assistance Program Information  We have supplied [employer] with any of your insurance information on file for billing purposes. [employer] offers a sliding scale Patient Assistance Program for patients that do not have  insurance, or whose insurance does not completely cover the cost of the ZIO monitor.  You must apply for the Patient Assistance Program to qualify for this discounted rate.  To apply, please call [employer] at [phone], select option 4, select option 2, ask to apply for  Patient Assistance Program. Meredeth Ide will ask your household income, and how many people  are in your household. They will quote your out-of-pocket cost based on that information.  [employer] will also be able to set up a 65-month, interest-free payment plan if needed.  Applying the monitor   Shave hair from upper left chest.  Hold abrader disc by orange tab. Rub abrader in 40 strokes over the upper left chest as  indicated in your monitor instructions.  Clean area with 4 enclosed alcohol pads. Let dry.  Apply patch as indicated in monitor instructions. Patch will be placed under collarbone on left  side of chest with arrow pointing upward.  Rub patch adhesive wings for 2 minutes. Remove white label marked "1". Remove the white  label marked "2". Rub patch adhesive wings for 2 additional minutes.  While looking in a mirror, press and release button in center of patch. A small green light will  flash 3-4 times. This will be your only indicator that the monitor has been turned on.  Do not shower for the first 24 hours. You may shower after the first 24 hours.  Press the button if you feel a symptom. You will hear a small click. Record Date, Time and  Symptom in the Patient Logbook.  When you are ready to remove the patch, follow instructions on the last 2 pages of Patient  Logbook. Stick patch monitor onto the last page of Patient Logbook.  Place Patient Logbook in the blue and white box. Use locking tab on box and tape box closed  securely. The blue and white box has prepaid postage on it. Please place it in the mailbox as  soon as possible. Your physician should have your test results approximately 7 days after the  monitor has been mailed back to [facility].  Call [facility] Customer Care at [phone] if you have questions regarding  your ZIO XT patch monitor. Call them immediately if you see an orange light blinking on your  monitor.  If your monitor falls off in less than 4 days, contact our Monitor department at [phone].  If your monitor becomes loose or falls off after 4 days call [employer] at [phone] for  suggestions on securing your monitor    Follow-Up: At [facility], you and your health needs are our priority.  As part of our continuing mission to provide you with exceptional heart care, we have created designated Provider Care Teams.  These Care Teams include your primary Cardiologist (physician) and Advanced Practice Providers (APPs -  Physician Assistants and Nurse Practitioners) who all work together to provide you with the care you need, when you need it.  We recommend signing up for the patient portal called "MyChart".  Sign up information is provided on this After Visit Summary.  MyChart is used to connect with patients for Virtual Visits (Telemedicine).  Patients are able to view lab/test results, encounter notes, upcoming appointments, etc.  Non-urgent messages can be sent to your provider as well.   To learn more about what  you can do with MyChart, go to [email or web address].    Your next appointment:   1 year(s)  The format for your next appointment:   In Person  Provider:    DR. Shari Prows   Signed, Meriam Sprague, MD  09/23/2021 4:52 PM    [facility]

## 2021-09-16 ENCOUNTER — Ambulatory Visit (AMBULATORY_SURGERY_CENTER): Payer: Medicare Other | Admitting: Gastroenterology

## 2021-09-16 ENCOUNTER — Encounter: Payer: Self-pay | Admitting: Gastroenterology

## 2021-09-16 VITALS — BP 112/72 | HR 82 | Temp 97.3°F | Resp 20 | Ht 65.0 in | Wt 230.0 lb

## 2021-09-16 DIAGNOSIS — Z800 Family history of malignant neoplasm of digestive organs: Secondary | ICD-10-CM

## 2021-09-16 DIAGNOSIS — Z86010 Personal history of colonic polyps: Secondary | ICD-10-CM | POA: Diagnosis not present

## 2021-09-16 MED ORDER — SODIUM CHLORIDE 0.9 % IV SOLN: 500.0000 mL | Freq: Once | INTRAVENOUS | Status: DC

## 2021-09-16 NOTE — Progress Notes (Signed)
Pt's states no medical or surgical changes since previsit or office visit. 

## 2021-09-16 NOTE — Patient Instructions (Signed)
Please read handouts provided. Continue present medications. High Fiber Diet. Return to GI clinic as needed.      YOU HAD AN ENDOSCOPIC PROCEDURE TODAY AT THE [facility]:   Refer to the procedure report that was given to you for any specific questions about what was found during the examination.  If the procedure report does not answer your questions, please call your gastroenterologist to clarify.  If you requested that your care partner not be given the details of your procedure findings, then the procedure report has been included in a sealed envelope for you to review at your convenience later.  YOU SHOULD EXPECT: Some feelings of bloating in the abdomen. Passage of more gas than usual.  Walking can help get rid of the air that was put into your GI tract during the procedure and reduce the bloating. If you had a lower endoscopy (such as a colonoscopy or flexible sigmoidoscopy) you may notice spotting of blood in your stool or on the toilet paper. If you underwent a bowel prep for your procedure, you may not have a normal bowel movement for a few days.  Please Note:  You might notice some irritation and congestion in your nose or some drainage.  This is from the oxygen used during your procedure.  There is no need for concern and it should clear up in a day or so.  SYMPTOMS TO REPORT IMMEDIATELY:   Following lower endoscopy (colonoscopy or flexible sigmoidoscopy):  Excessive amounts of blood in the stool  Significant tenderness or worsening of abdominal pains  Swelling of the abdomen that is new, acute  Fever of 100F or higher   For urgent or emergent issues, a gastroenterologist can be reached at any hour by calling [phone]. Do not use MyChart messaging for urgent concerns.    DIET:  We do recommend a small meal at first, but then you may proceed to your regular diet.  Drink plenty of fluids but you should avoid alcoholic beverages for 24 hours.  ACTIVITY:   You should plan to take it easy for the rest of today and you should NOT DRIVE or use heavy machinery until tomorrow (because of the sedation medicines used during the test).    FOLLOW UP: Our staff will call the number listed on your records 48-72 hours following your procedure to check on you and address any questions or concerns that you may have regarding the information given to you following your procedure. If we do not reach you, we will leave a message.  We will attempt to reach you two times.  During this call, we will ask if you have developed any symptoms of COVID 19. If you develop any symptoms (ie: fever, flu-like symptoms, shortness of breath, cough etc.) before then, please call [phone].  If you test positive for Covid 19 in the 2 weeks post procedure, please call and report this information to us.    If any biopsies were taken you will be contacted by phone or by letter within the next 1-3 weeks.  Please call us at [phone] if you have not heard about the biopsies in 3 weeks.    SIGNATURES/CONFIDENTIALITY: You and/or your care partner have signed paperwork which will be entered into your electronic medical record.  These signatures attest to the fact that that the information above on your After Visit Summary has been reviewed and is understood.  Full responsibility of the confidentiality of this discharge information lies with you   and/or your care-partner. 

## 2021-09-16 NOTE — Progress Notes (Signed)
D.T. vital signs. °

## 2021-09-16 NOTE — Progress Notes (Signed)
[facility] History and Physical   Primary Care Physician:  Christen Butter, NP   Reason for Procedure:   Colorectal cancer  surveillance  Plan:     patient with history of polyps,  for colonoscopy     HPI: Pamela Newman is a 65 y.o. female    Past Medical History:  Diagnosis Date   Allergy    Anxiety    Arthritis    Asthma    Cataract    Essential hypertension 11/21/2015   Heart disease    SMALL PROLAPSE   Hypertension    Memory loss    january 2022, 10 minutes memory loss, all tests normal and no incidence since   MVP (mitral valve prolapse)     Past Surgical History:  Procedure Laterality Date   BREAST BIOPSY Left    benign    BREAST SURGERY  2001   BREAST CYST   CATARACT EXTRACTION Bilateral    CESAREAN SECTION     COLONOSCOPY      Prior to Admission medications   Medication Sig Start Date End Date Taking? Authorizing Provider  acetaminophen (TYLENOL) 650 MG CR tablet Take 650 mg by mouth every 8 (eight) hours as needed.   Yes [provider]  Ascorbic Acid (VITAMIN C) 500 MG CAPS Take by mouth 2 (two) times daily.   Yes [provider]  atorvastatin (LIPITOR) 40 MG tablet Take 1 tablet (40 mg total) by mouth daily. 11/01/20  Yes Sunnie Nielsen, DO  buPROPion (WELLBUTRIN XL) 150 MG 24 hr tablet TAKE 1 TABLET BY MOUTH EVERY DAY 12/25/20  Yes Sunnie Nielsen, DO  cetirizine (ZYRTEC) 10 MG tablet TAKE 1 TABLET BY MOUTH TWICE A DAY 12/25/20  Yes Sunnie Nielsen, DO  losartan (COZAAR) 100 MG tablet TAKE 1 TABLET BY MOUTH EVERY DAY 12/25/20  Yes Sunnie Nielsen, DO  metoprolol succinate (TOPROL-XL) 25 MG 24 hr tablet TAKE 1 TABLET BY MOUTH EVERY DAY 12/11/20  Yes Sunnie Nielsen, DO  Semaglutide, 1 MG/DOSE, 4 MG/3ML SOPN Inject 1 mg as directed once a week. 08/23/21  Yes Christen Butter, NP  spironolactone (ALDACTONE) 100 MG tablet Take 200 mg by mouth daily. 01/07/21  Yes [provider]  calcium carbonate (OS-CAL) 600 MG  TABS tablet Take 600 mg by mouth daily with breakfast. Patient not taking: Reported on 09/16/2021    [provider]  Cholecalciferol (VITAMIN D) 50 MCG (2000 UT) CAPS Take by mouth 2 (two) times daily.    [provider]  COLLAGEN PO Take by mouth. Patient not taking: No sig reported    [provider]  diclofenac sodium (VOLTAREN) 1 % GEL Apply topically 4 (four) times daily. Takes PRN    [provider]  dutasteride (AVODART) 0.5 MG capsule Take by mouth. Patient not taking: No sig reported 01/07/21   [provider]  hydroxychloroquine (PLAQUENIL) 200 MG tablet Take by mouth. Patient not taking: No sig reported 01/07/21   [provider]  Ibuprofen-diphenhydrAMINE Cit (IBUPROFEN PM) 200-38 MG TABS Take by mouth at bedtime as needed.    [provider]  IVERMECTIN PO 24 mg. 02/26/21   [provider]  Lactobacillus (PROBIOTIC ACIDOPHILUS PO) Take by mouth.    [provider]  LORazepam (ATIVAN) 0.5 MG tablet Take one half (0.25mg ) tab 30 minutes prior to imaging. May take the remaining 0.25mg  if symptoms persist at the time of imaging. 11/08/20   Tollie Eth, NP  Multiple Vitamin (MULTIVITAMIN) tablet Take 1 tablet  by mouth daily.    [provider]  nitazoxanide Bethann Humble) 500 MG tablet  12/04/20   [provider]  Steffanie Rainwater 180 MCG/ACT inhaler SMARTSIG:2 Puff(s) By Mouth Twice Daily Patient not taking: No sig reported 01/08/21   [provider]  Zinc 50 MG CAPS Take by mouth. Taking 40 mg    [provider]    Current Outpatient Medications  Medication Sig Dispense Refill   acetaminophen (TYLENOL) 650 MG CR tablet Take 650 mg by mouth every 8 (eight) hours as needed.     Ascorbic Acid (VITAMIN C) 500 MG CAPS Take by mouth 2 (two) times daily.     atorvastatin (LIPITOR) 40 MG tablet Take 1 tablet (40 mg total) by mouth daily. 90 tablet 3   buPROPion (WELLBUTRIN XL) 150 MG  24 hr tablet TAKE 1 TABLET BY MOUTH EVERY DAY 90 tablet 3   cetirizine (ZYRTEC) 10 MG tablet TAKE 1 TABLET BY MOUTH TWICE A DAY 180 tablet 3   losartan (COZAAR) 100 MG tablet TAKE 1 TABLET BY MOUTH EVERY DAY 90 tablet 3   metoprolol succinate (TOPROL-XL) 25 MG 24 hr tablet TAKE 1 TABLET BY MOUTH EVERY DAY 90 tablet 3   Semaglutide, 1 MG/DOSE, 4 MG/3ML SOPN Inject 1 mg as directed once a week. 3 mL 0   spironolactone (ALDACTONE) 100 MG tablet Take 200 mg by mouth daily.     calcium carbonate (OS-CAL) 600 MG TABS tablet Take 600 mg by mouth daily with breakfast. (Patient not taking: Reported on 09/16/2021)     Cholecalciferol (VITAMIN D) 50 MCG (2000 UT) CAPS Take by mouth 2 (two) times daily.     COLLAGEN PO Take by mouth. (Patient not taking: No sig reported)     diclofenac sodium (VOLTAREN) 1 % GEL Apply topically 4 (four) times daily. Takes PRN     dutasteride (AVODART) 0.5 MG capsule Take by mouth. (Patient not taking: No sig reported)     hydroxychloroquine (PLAQUENIL) 200 MG tablet Take by mouth. (Patient not taking: No sig reported)     Ibuprofen-diphenhydrAMINE Cit (IBUPROFEN PM) 200-38 MG TABS Take by mouth at bedtime as needed.     IVERMECTIN PO 24 mg.     Lactobacillus (PROBIOTIC ACIDOPHILUS PO) Take by mouth.     LORazepam (ATIVAN) 0.5 MG tablet Take one half (0.25mg ) tab 30 minutes prior to imaging. May take the remaining 0.25mg  if symptoms persist at the time of imaging. 1 tablet 0   Multiple Vitamin (MULTIVITAMIN) tablet Take 1 tablet by mouth daily.     nitazoxanide (ALINIA) 500 MG tablet  (Patient not taking: No sig reported)     PULMICORT FLEXHALER 180 MCG/ACT inhaler SMARTSIG:2 Puff(s) By Mouth Twice Daily (Patient not taking: No sig reported)     Zinc 50 MG CAPS Take by mouth. Taking 40 mg     Current Facility-Administered Medications  Medication Dose Route Frequency Provider Last Rate Last Admin   0.9 %  sodium chloride infusion  500 mL Intravenous Once Lynann Bologna, MD         Allergies as of 09/16/2021   (No Known Allergies)    Family History  Problem Relation Age of Onset   Cancer Mother        BREAST   Hypertension Mother    Hyperlipidemia Mother    Hypertension Father    Hyperlipidemia Father    Stroke Father    Esophageal cancer Sister    Cancer Sister  LUNG   Colon cancer Maternal Grandfather    Cancer Maternal Grandfather        COLON    Social History   Socioeconomic History   Marital status: Married    Spouse name: Fredrik Cove   Number of children: 1   Years of education: 13   Highest education level: Some college, no degree  Occupational History    Comment: Retired  Tobacco Use   Smoking status: Never   Smokeless tobacco: Never  Vaping Use   Vaping Use: Never used  Substance and Sexual Activity   Alcohol use: Not Currently   Drug use: No   Sexual activity: Not on file  Other Topics Concern   Not on file  Social History Narrative   Likes with her husband. She enjoys doing crafts and refurnishing furniture.   Social Determinants of Health   Financial Resource Strain: Low Risk    Difficulty of Paying Living Expenses: Not hard at all  Food Insecurity: No Food Insecurity   Worried About Programme researcher, broadcasting/film/video in the Last Year: Never true   Ran Out of Food in the Last Year: Never true  Transportation Needs: No Transportation Needs   Lack of Transportation (Medical): No   Lack of Transportation (Non-Medical): No  Physical Activity: Inactive   Days of Exercise per Week: 0 days   Minutes of Exercise per Session: 0 min  Stress: No Stress Concern Present   Feeling of Stress : Not at all  Social Connections: Moderately Isolated   Frequency of Communication with Friends and Family: Three times a week   Frequency of Social Gatherings with Friends and Family: Twice a week   Attends Religious Services: Never   Database administrator or Organizations: No   Attends Engineer, structural: Never   Marital Status: Married   Catering manager Violence: Not At Risk   Fear of Current or Ex-Partner: No   Emotionally Abused: No   Physically Abused: No   Sexually Abused: No    Review of Systems: Positive for none All other review of systems negative except as mentioned in the HPI.  Physical Exam:   General:   Alert,  Well-developed, well-nourished, pleasant and cooperative in NAD Lungs:  Clear throughout to auscultation.   Heart:  Regular rate and rhythm; no murmurs, clicks, rubs,  or gallops. Abdomen:  Soft, nontender and nondistended. Normal bowel sounds.   Neuro/Psych:  Alert and cooperative. Normal mood and affect. A and O x 3    No significant changes were identified.  The patient continues to be an appropriate candidate for the planned procedure and anesthesia.   Edman Circle, MD. [facility] 09/16/2021 2:43 PM@

## 2021-09-16 NOTE — Progress Notes (Signed)
Report to PACU, RN, vss, BBS= Clear.  

## 2021-09-16 NOTE — Op Note (Signed)
[facility] Patient Name: Pamela Newman Procedure Date: 09/16/2021 2:40 PM MRN: [id number] Endoscopist: Lynann Bologna , MD Age: 65 Referring MD:  Date of Birth: 1956/09/09 Gender: Female Account #: [id number] Procedure:                Colonoscopy Indications:              High risk colon cancer surveillance: Personal                            history of colonic polyps. FH of colon cancer in a                            second-degree relative. Medicines:                Monitored Anesthesia Care Procedure:                Pre-Anesthesia Assessment:                           - Prior to the procedure, a History and Physical                            was performed, and patient medications and                            allergies were reviewed. The patient's tolerance of                            previous anesthesia was also reviewed. The risks                            and benefits of the procedure and the sedation                            options and risks were discussed with the patient.                            All questions were answered, and informed consent                            was obtained. Prior Anticoagulants: The patient has                            taken no previous anticoagulant or antiplatelet                            agents. ASA Grade Assessment: II - A patient with                            mild systemic disease. After reviewing the risks                            and benefits, the patient was deemed in  satisfactory condition to undergo the procedure.                           After obtaining informed consent, the colonoscope                            was passed under direct vision. Throughout the                            procedure, the patient's blood pressure, pulse, and                            oxygen saturations were monitored continuously. The                            PCF-HQ190L Colonoscope was introduced  through the                            anus and advanced to the 2 cm into the ileum. The                            colonoscopy was performed without difficulty. The                            patient tolerated the procedure well. The quality                            of the bowel preparation was good. The terminal                            ileum, ileocecal valve, appendiceal orifice, and                            rectum were photographed. Scope In: 2:52:20 PM Scope Out: 3:08:41 PM Scope Withdrawal Time: 0 hours 12 minutes 13 seconds  Total Procedure Duration: 0 hours 16 minutes 21 seconds  Findings:                 A few small-mouthed diverticula were found in the                            sigmoid colon and descending colon.                           Non-bleeding internal hemorrhoids were found during                            retroflexion. The hemorrhoids were small and Grade                            I (internal hemorrhoids that do not prolapse).                           The terminal ileum appeared normal.  The exam was otherwise without abnormality on                            direct and retroflexion views. Complications:            No immediate complications. Estimated Blood Loss:     Estimated blood loss: none. Impression:               - Mild left colonic diverticulosis.                           - Non-bleeding internal hemorrhoids.                           - The examined portion of the ileum was normal.                           - The examination was otherwise normal on direct                            and retroflexion views.                           - No specimens collected. Recommendation:           - Patient has a contact number available for                            emergencies. The signs and symptoms of potential                            delayed complications were discussed with the                            patient. Return to normal  activities tomorrow.                            Written discharge instructions were provided to the                            patient.                           - High fiber diet.                           - Continue present medications.                           - Repeat colonoscopy is not recommended due to                            current age (109 years or older) for screening                            purposes. Hence repeat colonoscopy only if with any  new problems or change in family history.                           - Return to GI office PRN.                           - The findings and recommendations were discussed                            with the patient's family. Lynann Bologna, MD 09/16/2021 3:13:35 PM This report has been signed electronically.

## 2021-09-18 ENCOUNTER — Telehealth: Payer: Self-pay | Admitting: *Deleted

## 2021-09-18 NOTE — Telephone Encounter (Signed)
  Follow up Call-  Call back number 09/16/2021  Post procedure Call Back phone  # [phone]  Permission to leave phone message Yes  Some recent data might be hidden     Patient questions:  Do you have a fever, pain , or abdominal swelling? No. Pain Score  0 *  Have you tolerated food without any problems? Yes.    Have you been able to return to your normal activities? Yes.    Do you have any questions about your discharge instructions: Diet   No. Medications  No. Follow up visit  No.  Do you have questions or concerns about your Care? No.  Actions: * If pain score is 4 or above: No action needed, pain <4.  Have you developed a fever since your procedure? no  2.   Have you had an respiratory symptoms (SOB or cough) since your procedure? no  3.   Have you tested positive for COVID 19 since your procedure no  4.   Have you had any family members/close contacts diagnosed with the COVID 19 since your procedure?  no   If yes to any of these questions please route to Laverna Peace, RN and Karlton Lemon, RN

## 2021-09-18 NOTE — Telephone Encounter (Signed)
  Follow up Call-  Call back number 09/16/2021  Post procedure Call Back phone  # [phone]  Permission to leave phone message Yes  Some recent data might be hidden     Patient questions: Message left to call if necessary.

## 2021-09-23 ENCOUNTER — Ambulatory Visit: Payer: Medicare Other | Admitting: Cardiology

## 2021-09-23 ENCOUNTER — Encounter: Payer: Self-pay | Admitting: Cardiology

## 2021-09-23 ENCOUNTER — Other Ambulatory Visit: Payer: Self-pay

## 2021-09-23 ENCOUNTER — Ambulatory Visit (INDEPENDENT_AMBULATORY_CARE_PROVIDER_SITE_OTHER): Payer: Medicare Other

## 2021-09-23 VITALS — BP 124/70 | HR 90 | Ht 65.0 in | Wt 237.0 lb

## 2021-09-23 DIAGNOSIS — Z0189 Encounter for other specified special examinations: Secondary | ICD-10-CM

## 2021-09-23 DIAGNOSIS — R002 Palpitations: Secondary | ICD-10-CM

## 2021-09-23 DIAGNOSIS — G459 Transient cerebral ischemic attack, unspecified: Secondary | ICD-10-CM

## 2021-09-23 DIAGNOSIS — E7849 Other hyperlipidemia: Secondary | ICD-10-CM

## 2021-09-23 DIAGNOSIS — I10 Essential (primary) hypertension: Secondary | ICD-10-CM

## 2021-09-23 NOTE — Patient Instructions (Signed)
Medication Instructions:   Your physician recommends that you continue on your current medications as directed. Please refer to the Current Medication list given to you today.  *If you need a refill on your cardiac medications before your next appointment, please call your pharmacy*   Lab Work:  TODAY--LIPIDS AND HEMOGLOBIN A1C  If you have labs (blood work) drawn today and your tests are completely normal, you will receive your results only by: MyChart Message (if you have MyChart) OR A paper copy in the mail If you have any lab test that is abnormal or we need to change your treatment, we will call you to review the results.   Testing/Procedures:  Your physician has requested that you have an echocardiogram. Echocardiography is a painless test that uses sound waves to create images of your heart. It provides your doctor with information about the size and shape of your heart and how well your heart's chambers and valves are working. This procedure takes approximately one hour. There are no restrictions for this procedure.   CARDIAC CALCIUM SCORE DONE HERE IN THE OFFICE--(SELF PAY)   ZIO XT- Long Term Monitor Instructions  Your physician has requested you wear a ZIO patch monitor for 14 days.  This is a single patch monitor. [employer] supplies one patch monitor per enrollment. Additional stickers are not available. Please do not apply patch if you will be having a Nuclear Stress Test,  Echocardiogram, Cardiac CT, MRI, or Chest Xray during the period you would be wearing the  monitor. The patch cannot be worn during these tests. You cannot remove and re-apply the  ZIO XT patch monitor.  Your ZIO patch monitor will be mailed 3 day USPS to your address on file. It may take 3-5 days  to receive your monitor after you have been enrolled.  Once you have received your monitor, please review the enclosed instructions. Your monitor  has already been registered assigning a specific monitor  serial # to you.  Billing and Patient Assistance Program Information  We have supplied [employer] with any of your insurance information on file for billing purposes. [employer] offers a sliding scale Patient Assistance Program for patients that do not have  insurance, or whose insurance does not completely cover the cost of the ZIO monitor.  You must apply for the Patient Assistance Program to qualify for this discounted rate.  To apply, please call [employer] at [phone], select option 4, select option 2, ask to apply for  Patient Assistance Program. Meredeth Ide will ask your household income, and how many people  are in your household. They will quote your out-of-pocket cost based on that information.  [employer] will also be able to set up a 31-month, interest-free payment plan if needed.  Applying the monitor   Shave hair from upper left chest.  Hold abrader disc by orange tab. Rub abrader in 40 strokes over the upper left chest as  indicated in your monitor instructions.  Clean area with 4 enclosed alcohol pads. Let dry.  Apply patch as indicated in monitor instructions. Patch will be placed under collarbone on left  side of chest with arrow pointing upward.  Rub patch adhesive wings for 2 minutes. Remove white label marked "1". Remove the white  label marked "2". Rub patch adhesive wings for 2 additional minutes.  While looking in a mirror, press and release button in center of patch. A small green light will  flash 3-4 times. This will be your only indicator that the monitor  has been turned on.  Do not shower for the first 24 hours. You may shower after the first 24 hours.  Press the button if you feel a symptom. You will hear a small click. Record Date, Time and  Symptom in the Patient Logbook.  When you are ready to remove the patch, follow instructions on the last 2 pages of Patient  Logbook. Stick patch monitor onto the last page of Patient Logbook.  Place Patient Logbook in the blue  and white box. Use locking tab on box and tape box closed  securely. The blue and white box has prepaid postage on it. Please place it in the mailbox as  soon as possible. Your physician should have your test results approximately 7 days after the  monitor has been mailed back to [facility].  Call [facility] Customer Care at [phone] if you have questions regarding  your ZIO XT patch monitor. Call them immediately if you see an orange light blinking on your  monitor.  If your monitor falls off in less than 4 days, contact our Monitor department at [phone].  If your monitor becomes loose or falls off after 4 days call [employer] at [phone] for  suggestions on securing your monitor    Follow-Up: At [facility], you and your health needs are our priority.  As part of our continuing mission to provide you with exceptional heart care, we have created designated Provider Care Teams.  These Care Teams include your primary Cardiologist (physician) and Advanced Practice Providers (APPs -  Physician Assistants and Nurse Practitioners) who all work together to provide you with the care you need, when you need it.  We recommend signing up for the patient portal called "MyChart".  Sign up information is provided on this After Visit Summary.  MyChart is used to connect with patients for Virtual Visits (Telemedicine).  Patients are able to view lab/test results, encounter notes, upcoming appointments, etc.  Non-urgent messages can be sent to your provider as well.   To learn more about what you can do with MyChart, go to [email or web address].    Your next appointment:   1 year(s)  The format for your next appointment:   In Person  Provider:    DR. Shari Prows

## 2021-09-23 NOTE — Progress Notes (Unsigned)
Enrolled for [employer] to mail a ZIO XT long term holter monitor to the patients address on file.  

## 2021-09-24 ENCOUNTER — Encounter: Payer: Self-pay | Admitting: Medical-Surgical

## 2021-09-24 ENCOUNTER — Ambulatory Visit (INDEPENDENT_AMBULATORY_CARE_PROVIDER_SITE_OTHER): Payer: Medicare Other | Admitting: Medical-Surgical

## 2021-09-24 VITALS — Ht 65.0 in | Wt 233.0 lb

## 2021-09-24 DIAGNOSIS — F32A Depression, unspecified: Secondary | ICD-10-CM | POA: Diagnosis not present

## 2021-09-24 DIAGNOSIS — Z7689 Persons encountering health services in other specified circumstances: Secondary | ICD-10-CM

## 2021-09-24 DIAGNOSIS — E7849 Other hyperlipidemia: Secondary | ICD-10-CM | POA: Diagnosis not present

## 2021-09-24 DIAGNOSIS — I10 Essential (primary) hypertension: Secondary | ICD-10-CM

## 2021-09-24 LAB — LIPID PANEL
Chol/HDL Ratio: 2.8 ratio (ref 0.0–4.4)
Cholesterol, Total: 113 mg/dL (ref 100–199)
HDL: 40 mg/dL (ref >39)
LDL Chol Calc (NIH): 49 mg/dL (ref 0–99)
Triglycerides: 138 mg/dL (ref 0–149)
VLDL Cholesterol Cal: 24 mg/dL (ref 5–40)

## 2021-09-24 LAB — HEMOGLOBIN A1C
Est. average glucose Bld gHb Est-mCnc: 105 mg/dL
Hgb A1c MFr Bld: 5.3 % (ref 4.8–5.6)

## 2021-09-24 MED ORDER — SEMAGLUTIDE (2 MG/DOSE) 8 MG/3ML ~~LOC~~ SOPN: 2.0000 mg | PEN_INJECTOR | SUBCUTANEOUS | 2 refills | Status: DC

## 2021-09-24 NOTE — Progress Notes (Signed)
Virtual Visit via Telephone   I connected with  Pamela Newman  on 09/24/21 by telephone/telehealth and verified that I am speaking with the correct person using two identifiers.   I discussed the limitations, risks, security and privacy concerns of performing an evaluation and management service by telephone, including the higher likelihood of inaccurate diagnosis and treatment, and the availability of in person appointments.  We also discussed the likely need of an additional face to face encounter for complete and high quality delivery of care.  I also discussed with the patient that there may be a patient responsible charge related to this service. The patient expressed understanding and wishes to proceed.  Provider location is in medical facility. Patient location is at their home, different from provider location. People involved in care of the patient during this telehealth encounter were myself, my nurse/medical assistant, and my front office/scheduling team member.  CC: Weight management  HPI: Pleasant 65 year old female presenting via telephone call to discuss weight management.  She has been using semaglutide 1 mg weekly as prescribed, tolerating well without side effects.  Is doing very well on the medication and notes that her appetite is significantly reduced.  Notes that she has stalled out a little bit over the last few weeks and has not been as religious with exercise and dietary management.  She is aware of what she needs to do to fix this and plans to get started after the holiday.  She does use a stool softener as needed but has no other significant GI issues.  Saw cardiology yesterday and is very happy with her visit there.  She will be undergoing some testing soon per Dr. Shari Prows.  Review of Systems: See HPI for pertinent positives and negatives.   Objective Findings:    General: Speaking full sentences, no audible heavy breathing.  Sounds alert and appropriately interactive.     Impression and Recommendations:    1. Encounter for weight management Approximately 4 pound weight loss in the last 4 weeks.  Increasing semaglutide to 2 mg weekly.  Recommend returning to regular intentional exercise and stricter dietary management.  Continue stool softener as needed.  2. Essential hypertension Blood pressure looked good at yesterday's visit so no significant concerns.  Continue current medications.  3. Depression, unspecified depression type Doing well overall.  No changes in medications.  4. Other hyperlipidemia Cholesterol looked great yesterday but she will be under going further testing for cardiology.  For now continue atorvastatin 40 mg daily.  I discussed the above assessment and treatment plan with the patient. The patient was provided an opportunity to ask questions and all were answered. The patient agreed with the plan and demonstrated an understanding of the instructions.   The patient was advised to call back or seek an in-person evaluation if the symptoms worsen or if the condition fails to improve as anticipated.  15 minutes of non-face-to-face time was provided during this encounter.  Return in about 4 weeks (around 10/22/2021) for Weight management (virtual is fine). ___________________________________________ Christen Butter, DNP, APRN, FNP-BC Primary Care and Sports Medicine [facility] [place]

## 2021-09-27 DIAGNOSIS — G459 Transient cerebral ischemic attack, unspecified: Secondary | ICD-10-CM

## 2021-09-27 DIAGNOSIS — R002 Palpitations: Secondary | ICD-10-CM

## 2021-10-02 ENCOUNTER — Ambulatory Visit: Payer: Medicare Other

## 2021-10-02 ENCOUNTER — Other Ambulatory Visit: Payer: Medicare Other

## 2021-10-03 ENCOUNTER — Other Ambulatory Visit: Payer: Self-pay | Admitting: Osteopathic Medicine

## 2021-10-11 ENCOUNTER — Encounter: Payer: Self-pay | Admitting: Medical-Surgical

## 2021-10-15 ENCOUNTER — Ambulatory Visit (HOSPITAL_COMMUNITY): Payer: Medicare Other | Attending: Internal Medicine

## 2021-10-15 ENCOUNTER — Ambulatory Visit (INDEPENDENT_AMBULATORY_CARE_PROVIDER_SITE_OTHER)
Admission: RE | Admit: 2021-10-15 | Discharge: 2021-10-15 | Disposition: A | Discharge status: Home / Self Care | Payer: Self-pay | Source: Ambulatory Visit | Attending: Cardiology | Admitting: Cardiology

## 2021-10-15 ENCOUNTER — Other Ambulatory Visit: Payer: Self-pay

## 2021-10-15 DIAGNOSIS — R002 Palpitations: Secondary | ICD-10-CM | POA: Diagnosis not present

## 2021-10-15 DIAGNOSIS — G459 Transient cerebral ischemic attack, unspecified: Secondary | ICD-10-CM | POA: Diagnosis not present

## 2021-10-15 DIAGNOSIS — E7849 Other hyperlipidemia: Secondary | ICD-10-CM

## 2021-10-15 LAB — ECHOCARDIOGRAM COMPLETE
Area-P 1/2: 3.65 cm2
S' Lateral: 2.6 cm

## 2021-10-23 ENCOUNTER — Other Ambulatory Visit: Payer: Self-pay

## 2021-10-23 ENCOUNTER — Ambulatory Visit (INDEPENDENT_AMBULATORY_CARE_PROVIDER_SITE_OTHER): Payer: Medicare Other

## 2021-10-23 DIAGNOSIS — Z1231 Encounter for screening mammogram for malignant neoplasm of breast: Secondary | ICD-10-CM | POA: Diagnosis not present

## 2021-10-23 DIAGNOSIS — Z780 Asymptomatic menopausal state: Secondary | ICD-10-CM | POA: Diagnosis not present

## 2021-10-23 DIAGNOSIS — Z0000 Encounter for general adult medical examination without abnormal findings: Secondary | ICD-10-CM | POA: Diagnosis not present

## 2021-10-31 ENCOUNTER — Other Ambulatory Visit: Payer: Self-pay | Admitting: Osteopathic Medicine

## 2021-10-31 DIAGNOSIS — F32A Depression, unspecified: Secondary | ICD-10-CM

## 2021-10-31 DIAGNOSIS — I10 Essential (primary) hypertension: Secondary | ICD-10-CM

## 2021-11-06 ENCOUNTER — Telehealth: Payer: Medicare Other | Admitting: Medical-Surgical

## 2021-11-19 ENCOUNTER — Telehealth: Payer: Medicare Other | Admitting: Medical-Surgical

## 2021-11-19 ENCOUNTER — Encounter: Payer: Self-pay | Admitting: Medical-Surgical

## 2021-11-19 VITALS — BP 109/66 | HR 82 | Ht 65.0 in | Wt 227.0 lb

## 2021-11-19 DIAGNOSIS — Z7689 Persons encountering health services in other specified circumstances: Secondary | ICD-10-CM | POA: Diagnosis not present

## 2021-11-19 NOTE — Progress Notes (Signed)
Virtual Visit via Video Note  I connected with Pamela Newman on 11/19/21 at 10:50 AM EST by a video enabled telemedicine application and verified that I am speaking with the correct person using two identifiers.   I discussed the limitations of evaluation and management by telemedicine and the availability of in person appointments. The patient expressed understanding and agreed to proceed.  Patient location: home Provider locations: office  Subjective:    CC: Weight management  HPI: Pleasant 66 year old female presenting today via MyChart video visit for discussion regarding weight management.  She has increased to semaglutide 2 mg weekly and is tolerating very well without side effects.  She has been continuing to eat smaller portions and make healthier dietary choices.  Admits that she is not exercising as regularly as she should.  Is monitoring her weight at home closely and very happy with her overall results.  Notes that she feels better overall and can tell when she eats poorly because she starts to feel bad.  Past medical history, Surgical history, Family history not pertinant except as noted below, Social history, Allergies, and medications have been entered into the medical record, reviewed, and corrections made.   Review of Systems: See HPI for pertinent positives and negatives.   Objective:    General: Speaking clearly in complete sentences without any shortness of breath.  Alert and oriented x3.  Normal judgment. No apparent acute distress.  Impression and Recommendations:    1. Encounter for weight management She has done very well overall with 10 pounds lost in the last 2 months and a total of 16 pounds lost since September.  Notes that her weight loss is slow but steady.  Recommend continuing healthy dietary choices and smaller portions.  Recommend resuming intentional exercise at least 3 times weekly.  I discussed the assessment and treatment plan with the patient. The  patient was provided an opportunity to ask questions and all were answered. The patient agreed with the plan and demonstrated an understanding of the instructions.   The patient was advised to call back or seek an in-person evaluation if the symptoms worsen or if the condition fails to improve as anticipated.  20 minutes of non-face-to-face time was provided during this encounter.  Return in about 3 months (around 02/17/2022) for weight check.  Thayer Ohm, DNP, APRN, FNP-BC [facility] and Sports Medicine

## 2021-12-29 ENCOUNTER — Other Ambulatory Visit: Payer: Self-pay | Admitting: Medical-Surgical

## 2022-01-03 ENCOUNTER — Other Ambulatory Visit: Payer: Self-pay | Admitting: Osteopathic Medicine

## 2022-01-03 DIAGNOSIS — I10 Essential (primary) hypertension: Secondary | ICD-10-CM

## 2022-02-07 ENCOUNTER — Other Ambulatory Visit: Payer: Self-pay | Admitting: Medical-Surgical

## 2022-02-11 ENCOUNTER — Encounter: Payer: Self-pay | Admitting: Medical-Surgical

## 2022-02-11 MED ORDER — SEMAGLUTIDE (1 MG/DOSE) 4 MG/3ML ~~LOC~~ SOPN: 1.0000 mg | PEN_INJECTOR | SUBCUTANEOUS | 0 refills | Status: DC

## 2022-03-04 ENCOUNTER — Encounter: Payer: Self-pay | Admitting: Medical-Surgical

## 2022-03-04 ENCOUNTER — Ambulatory Visit: Payer: Medicare Other | Admitting: Medical-Surgical

## 2022-03-04 VITALS — BP 99/68 | HR 89 | Resp 20 | Ht 65.0 in | Wt 224.0 lb

## 2022-03-04 DIAGNOSIS — J019 Acute sinusitis, unspecified: Secondary | ICD-10-CM

## 2022-03-04 DIAGNOSIS — I10 Essential (primary) hypertension: Secondary | ICD-10-CM

## 2022-03-04 DIAGNOSIS — Z7689 Persons encountering health services in other specified circumstances: Secondary | ICD-10-CM

## 2022-03-04 MED ORDER — SEMAGLUTIDE (2 MG/DOSE) 8 MG/3ML ~~LOC~~ SOPN: 2.0000 mg | PEN_INJECTOR | SUBCUTANEOUS | 1 refills | Status: DC

## 2022-03-04 MED ORDER — AMOXICILLIN-POT CLAVULANATE 875-125 MG PO TABS: 1.0000 | ORAL_TABLET | Freq: Two times a day (BID) | ORAL | 0 refills | Status: DC

## 2022-03-04 NOTE — Progress Notes (Signed)
?  HPI with pertinent ROS:  ? ?CC: weight follow up ? ?HPI: ?Pleasant 66 year old female presenting today for the following: ? ?Weight- using semaglutide 1mg  weekly for 1 week due to supply issues but went back up to 2mg  weekly. Still has a decreased appetite and eats much smaller portions. Working on getting more active now that the weather is getting warmer. Knows that she still could do more but is happy that she is holding steady.  ? ?Hypertension-has been checking blood pressure at home with readings slightly higher than they are today however they have all been within normal range.  Taking metoprolol 25 mg daily and losartan 100 mg daily, tolerating both medications well without side effects.  Wonders if she is at a point where she could cut back on the medication just a little bit since she has lost over 30 pounds. Denies CP, SOB, palpitations, lower extremity edema, dizziness, headaches, or vision changes. ? ?Reports having about 1 week of head congestion with sore throat, subjective fevers, and nasal drainage/postnasal drip.  She has been exceedingly fatigued and has had some ear pressure and pain.  She was exposed to her granddaughter who had strep.  She went to [facility] on Sunday and notes that they tested her for strep with negative results.  She has been using over-the-counter medications for relief of symptoms however these have not been exceedingly helpful and her symptoms have continued to worsen. ? ?I reviewed the past medical history, family history, social history, surgical history, and allergies today and no changes were needed.  Please see the problem list section below in epic for further details. ? ?Physical exam:  ? ?General: Well Developed, well nourished, and in no acute distress.  ?Neuro: Alert and oriented x3.  ?HEENT: Normocephalic, atraumatic.  ?Skin: Warm and dry. ?Cardiac: Regular rate and rhythm, no murmurs rubs or gallops, no lower extremity edema.  ?Respiratory: Clear to  auscultation bilaterally. Not using accessory muscles, speaking in full sentences. ? ?Impression and Recommendations:   ? ?1. Essential hypertension ?Blood pressure looks fabulous today.  She has lost approximately 33 pounds so I feel that we would be safe and making some changes to her blood pressure medication as long as she watches it closely at home.  For now continue Toprol-XL 25 mg daily.  Reduce losartan to 50 mg daily over the next couple of weeks and monitor blood pressure daily.  If consistently under 130/80, okay to continue losartan at 50 mg.  If it begins to run higher, resume losartan 100 mg daily.  Patient verbalized understanding is agreeable to the plan. ? ?2. Encounter for weight management ?She has done exceedingly well and lost 33 pounds so far.  Recommend increasing regular intentional activity and making sure to monitor dietary choices.  Continue semaglutide 2 mg weekly ? ?3. Acute non-recurrent sinusitis, unspecified location ?Since she has had a week of symptoms, we will go ahead and treat with Augmentin twice daily x7 days.  Continue over-the-counter and home supportive treatment as needed. ? ?Return in about 6 months (around 09/04/2022) for weight check, HTN follow up. ?___________________________________________ ?Thursday, DNP, APRN, FNP-BC ?Primary Care and Sports Medicine ?[facility] 13/12/2021 ?

## 2022-04-03 ENCOUNTER — Other Ambulatory Visit: Payer: Self-pay | Admitting: Medical-Surgical

## 2022-04-03 DIAGNOSIS — I10 Essential (primary) hypertension: Secondary | ICD-10-CM

## 2022-05-20 ENCOUNTER — Encounter: Payer: Self-pay | Admitting: Medical-Surgical

## 2022-06-10 ENCOUNTER — Other Ambulatory Visit: Payer: Self-pay | Admitting: Medical-Surgical

## 2022-06-11 ENCOUNTER — Other Ambulatory Visit: Payer: Self-pay | Admitting: Medical-Surgical

## 2022-06-12 ENCOUNTER — Other Ambulatory Visit: Payer: Self-pay | Admitting: Medical-Surgical

## 2022-06-18 IMAGING — CT CT CARDIAC CORONARY ARTERY CALCIUM SCORE
3 series · 14 of 20 positions shown, 16 images · non-contrast
Comparison: 10/02/2017 CTA chest
COMPARISON: 10/02/2017 CTA chest

Addendum:
EXAM:
OVER-READ INTERPRETATION  CT CHEST

The following report is an over-read performed by radiologist Dr.
over-read does not include interpretation of cardiac or coronary
anatomy or pathology. The calcium score interpretation by the
cardiologist is attached.
CLINICAL DATA: Cardiovascular Disease Risk stratification
Coronary Calcium Score
TECHNIQUE: A gated, non-contrast computed tomography scan of the heart was
performed using 3mm slice thickness. Axial images were analyzed on a
dedicated workstation. Calcium scoring of the coronary arteries was
performed using the Agatston method.

[Series 2: cascseq 2.0 sa36 (id) (id) · axial · 0.39mm/px · z∈[-208,-128]mm · 4 of 68 slices shown]
[im 14/68  vessel]
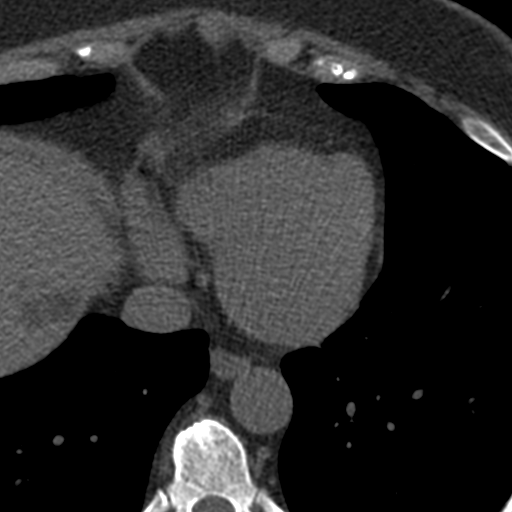
[im 27/68  vessel]
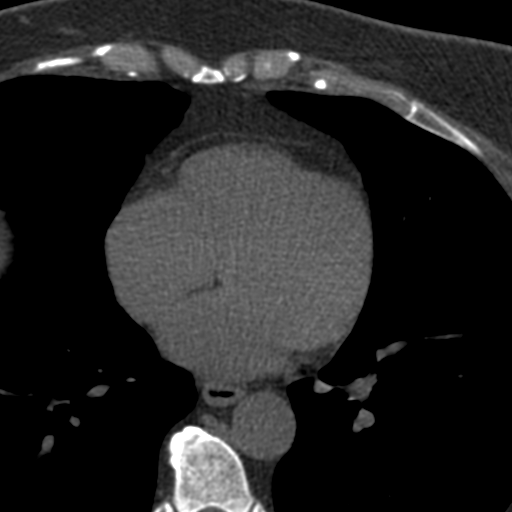
[im 41/68  vessel]
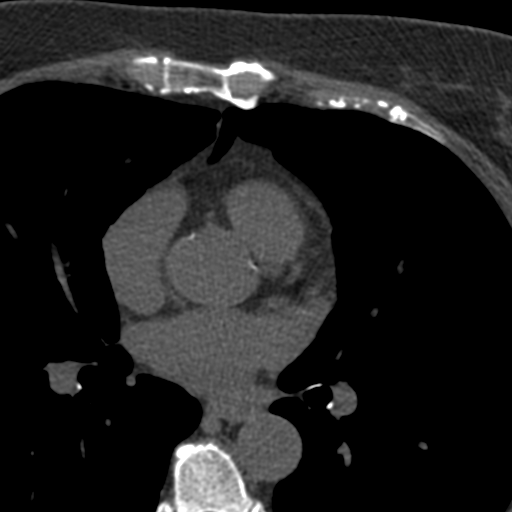
[im 54/68  vessel]
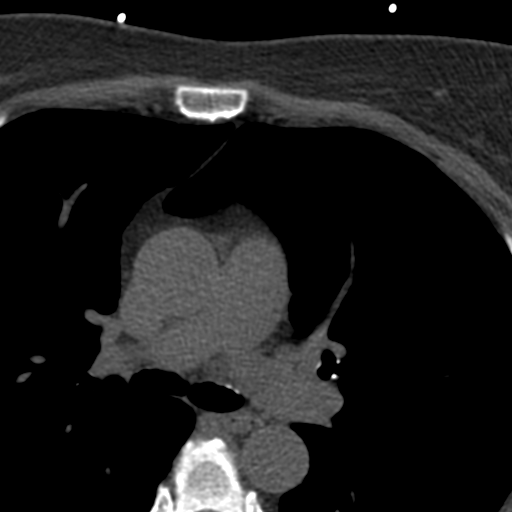

[Series 3: cascseq 2.0 bf37 st · axial · 0.75mm/px · z∈[-212,-124]mm · 5 of 68 slices shown, 7 images]
[im 12/68  vessel]
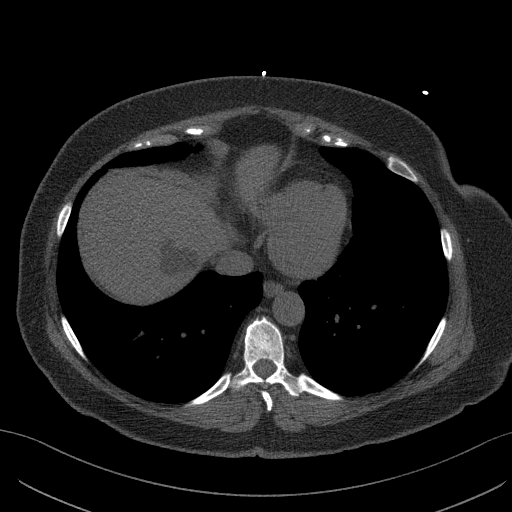
[im 12/68  lung]
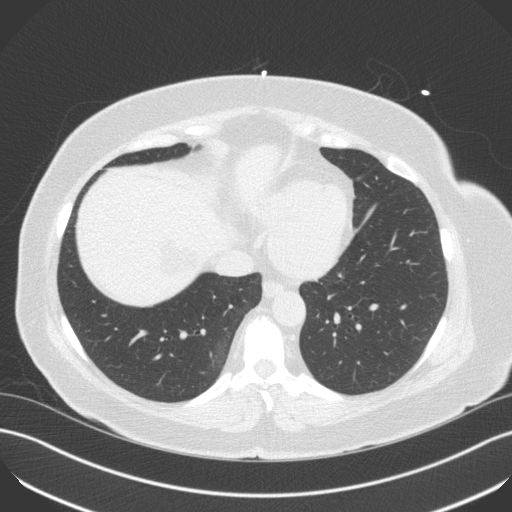
[im 23/68  vessel]
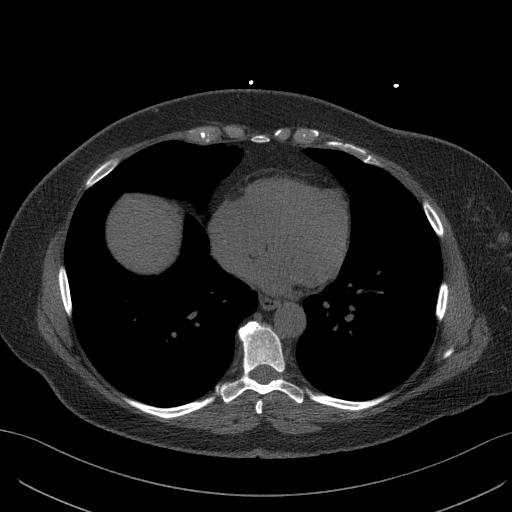
[im 34/68  vessel]
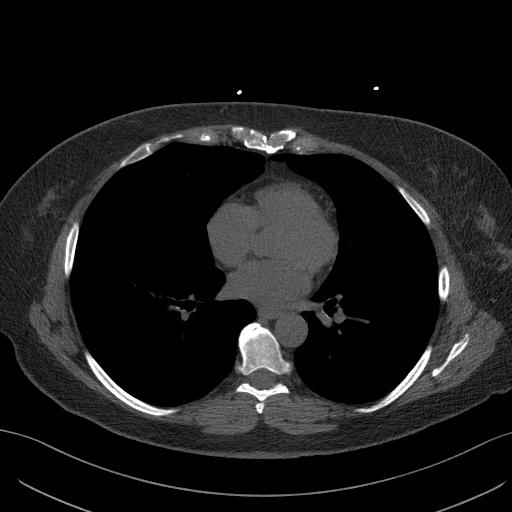
[im 45/68  vessel]
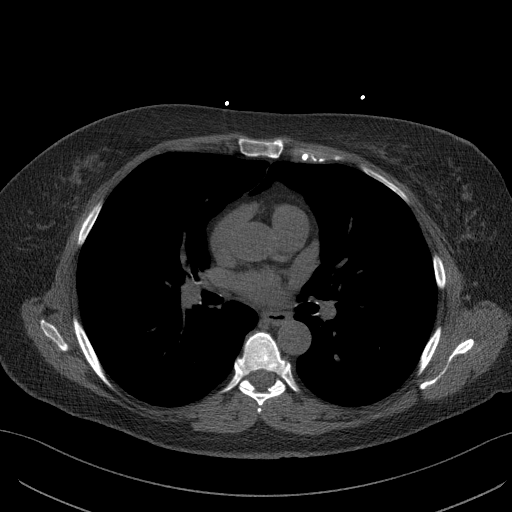
[im 56/68  vessel]
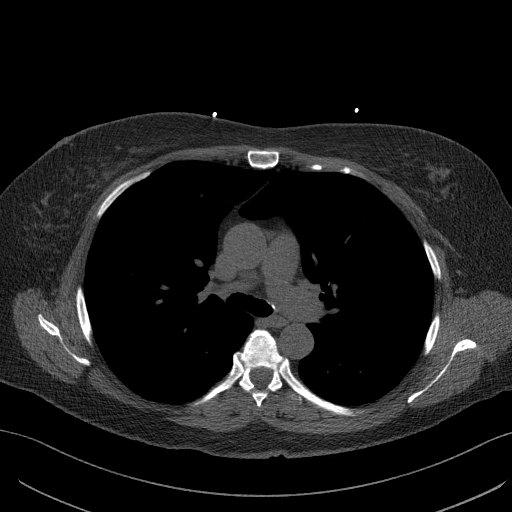
[im 56/68  lung]
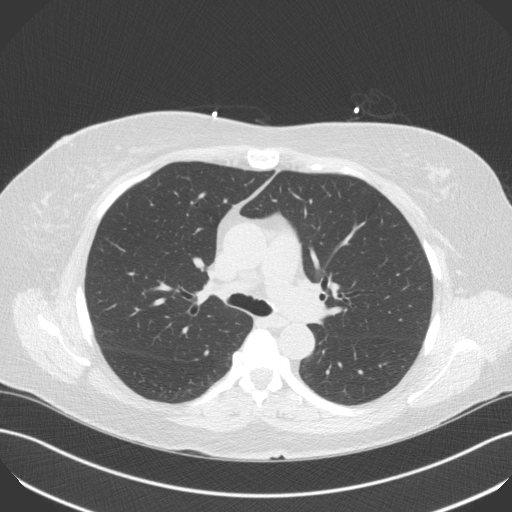

[Series 4: cascseq 2.0 br59 lung · axial · 0.75mm/px · z∈[-212,-124]mm · 5 of 68 slices shown]
[im 12/68  lung]
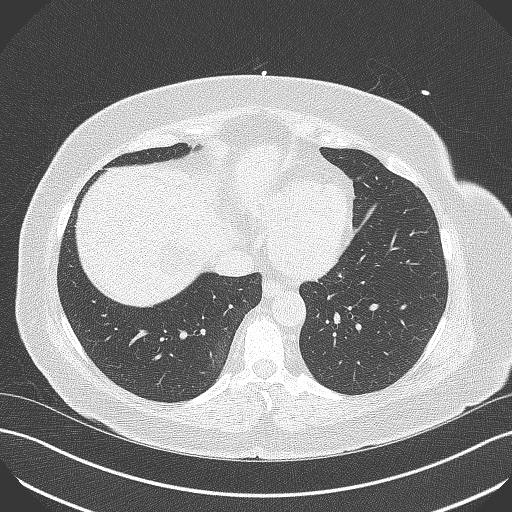
[im 23/68  lung]
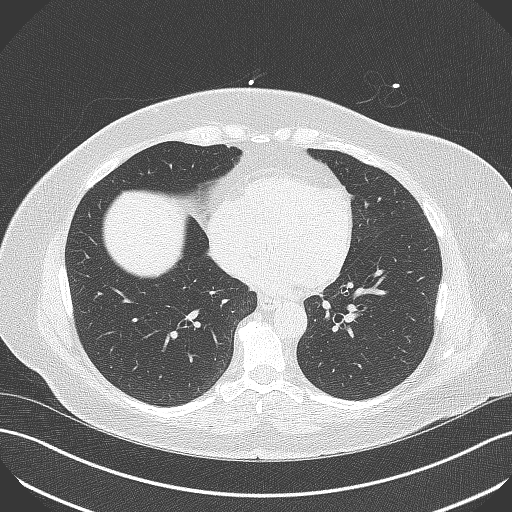
[im 34/68  lung]
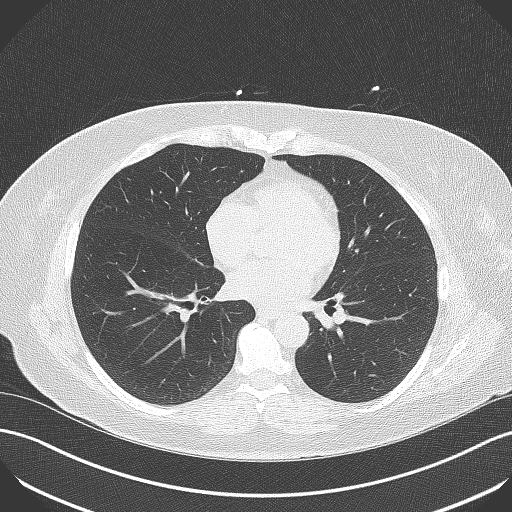
[im 45/68  lung]
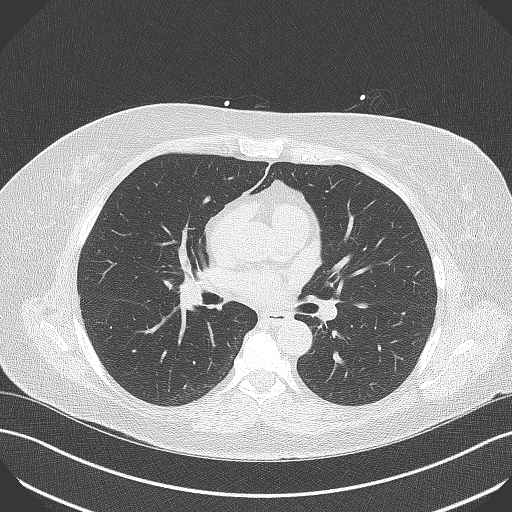
[im 56/68  lung]
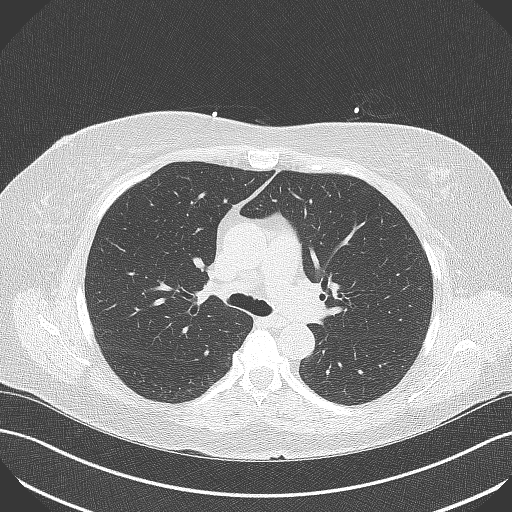

[14 of 20 positions shown; findings below may reference images not displayed]

FINDINGS: Vascular: Aortic atherosclerosis.

Mediastinum/Nodes: No imaged thoracic adenopathy.

Lungs/Pleura: No pleural fluid. Right lower lobe adjacent pulmonary
nodules on 32/4 of up to 5 mm are similar in 2182 and can be
presumed benign

A nodule along the right major fissure at 3 mm is most likely a
subpleural lymph node on [DATE], possibly present on the prior.

Left major fissure thickening or a subpleural lymph node on [DATE].

Upper Abdomen: Hepatic dome 5.0 cm hypoattenuating lesion on 66/3
was present and similar in size in 2182, presumed benign and likely
a cyst.

Musculoskeletal: No acute osseous abnormality. Moderate lower
thoracic spondylosis.
IMPRESSION: 1.  No acute findings in the imaged extracardiac chest.
2.  Aortic Atherosclerosis (DMZ6E-2JL.L).
FINDINGS: Coronary arteries: Normal origins.

Coronary Calcium Score:

Left main: 0

Left anterior descending artery: 0

Left circumflex artery: 0

Right coronary artery: 0

Total: 0

Percentile: 0

Pericardium: Normal.

Trivial aortic root and mitral annular calcification noted.

Aorta: Normal caliber of ascending aorta.

Non-cardiac: See separate report from [REDACTED].
IMPRESSION: Coronary calcium score of 0. This was 0 percentile for age-, race-,
and sex-matched controls.



If CAC=0, it is reasonable to withhold statin therapy and reassess
in 5 to 10 years, as long as higher risk conditions are absent
(diabetes mellitus, family history of premature CHD in first degree
relatives (males <55 years; females <65 years), cigarette smoking,
or LDL >=190 mg/dL).

If CAC is 1 to 99, it is reasonable to initiate statin therapy for
patients >=55 years of age.

If CAC is >=100 or >=75th percentile, it is reasonable to initiate
statin therapy at any age.

Cardiology referral should be considered for patients with CAC
scores >=400 or >=75th percentile.

*2182 AHA/ACC/AACVPR/AAPA/ABC/BARANDONDERA/BRIGHT/JUMPER/Plasencia/BAFETIMBI/PUNJABI/SILES
Guideline on the Management of Blood Cholesterol: A Report of the
American College of Cardiology/American Heart Association Task Force
on Clinical Practice Guidelines. J Am Coll Cardiol.
8914;73(24):6946-6058.

*** End of Addendum ***
EXAM:
OVER-READ INTERPRETATION  CT CHEST

The following report is an over-read performed by radiologist Dr.
over-read does not include interpretation of cardiac or coronary
anatomy or pathology. The calcium score interpretation by the
cardiologist is attached.
FINDINGS: Vascular: Aortic atherosclerosis.

Mediastinum/Nodes: No imaged thoracic adenopathy.

Lungs/Pleura: No pleural fluid. Right lower lobe adjacent pulmonary
nodules on 32/4 of up to 5 mm are similar in 2182 and can be
presumed benign

A nodule along the right major fissure at 3 mm is most likely a
subpleural lymph node on [DATE], possibly present on the prior.

Left major fissure thickening or a subpleural lymph node on [DATE].

Upper Abdomen: Hepatic dome 5.0 cm hypoattenuating lesion on 66/3
was present and similar in size in 2182, presumed benign and likely
a cyst.

Musculoskeletal: No acute osseous abnormality. Moderate lower
thoracic spondylosis.
IMPRESSION: 1.  No acute findings in the imaged extracardiac chest.
2.  Aortic Atherosclerosis (DMZ6E-2JL.L).

## 2022-06-25 ENCOUNTER — Encounter: Payer: Self-pay | Admitting: General Practice

## 2022-06-27 ENCOUNTER — Ambulatory Visit: Payer: Medicare Other | Admitting: Medical-Surgical

## 2022-07-15 ENCOUNTER — Telehealth (INDEPENDENT_AMBULATORY_CARE_PROVIDER_SITE_OTHER): Payer: Medicare Other | Admitting: Sports Medicine

## 2022-07-15 DIAGNOSIS — R051 Acute cough: Secondary | ICD-10-CM | POA: Diagnosis not present

## 2022-07-15 DIAGNOSIS — R059 Cough, unspecified: Secondary | ICD-10-CM | POA: Insufficient documentation

## 2022-07-15 MED ORDER — AZITHROMYCIN 250 MG PO TABS: ORAL_TABLET | ORAL | 0 refills | Status: DC

## 2022-07-15 MED ORDER — PREDNISONE 50 MG PO TABS: ORAL_TABLET | ORAL | 0 refills | Status: DC

## 2022-07-15 NOTE — Progress Notes (Signed)
   Virtual Visit via Telephone   I connected with  Pamela Newman  on 07/15/22 by telephone/telehealth and verified that I am speaking with the correct person using two identifiers.   I discussed the limitations, risks, security and privacy concerns of performing an evaluation and management service by telephone, including the higher likelihood of inaccurate diagnosis and treatment, and the availability of in person appointments.  We also discussed the likely need of an additional face to face encounter for complete and high quality delivery of care.  I also discussed with the patient that there may be a patient responsible charge related to this service. The patient expressed understanding and wishes to proceed.  Provider location is in medical facility. Patient location is at their home, different from provider location. People involved in care of the patient during this telehealth encounter were myself, my nurse/medical assistant, and my front office/scheduling team member.  Review of Systems: No fevers, chills, night sweats, weight loss, chest pain, or shortness of breath.   Objective Findings:    General: Speaking full sentences, no audible heavy breathing.  Sounds alert and appropriately interactive.    Independent interpretation of tests performed by another provider:   None.  Brief History, Exam, Impression, and Recommendations:    Coughing This is a very pleasant 66 year old female, she she has some grandchildren in school and they have brought home multiple illnesses.  More recently for the past 2 weeks she has had an increasing cough, fevers to 99 degrees, sore throat and runny nose but no overt chills or body aches. Coughing up simple mucus without blood. No other symptoms, speaking full sentences on the phone, I suspect she has a bronchitis, explained the difficulty in differentiating bronchitis and pneumonia without a stethoscope or chest x-ray, we will start conservatively with  prednisone and azithromycin, she does not desire any cough medication and does not have any nocturnal symptoms. She can return to see [place] if not better in 5 days to a week.   I discussed the above assessment and treatment plan with the patient. The patient was provided an opportunity to ask questions and all were answered. The patient agreed with the plan and demonstrated an understanding of the instructions.   The patient was advised to call back or seek an in-person evaluation if the symptoms worsen or if the condition fails to improve as anticipated.   I provided 30 minutes of verbal and non-verbal time during this encounter date, time was needed to gather information, review chart, records, communicate/coordinate with staff remotely, as well as complete documentation.   ____________________________________________ Pamela Newman. Benjamin Stain, M.D., ABFM., CAQSM., AME. Primary Care and Sports Medicine [facility]  Adjunct Professor of Family Medicine  [employer]  Restaurant manager, fast food

## 2022-07-15 NOTE — Assessment & Plan Note (Signed)
This is a very pleasant 66 year old female, she she has some grandchildren in school and they have brought home multiple illnesses.  More recently for the past 2 weeks she has had an increasing cough, fevers to 99 degrees, sore throat and runny nose but no overt chills or body aches. Coughing up simple mucus without blood. No other symptoms, speaking full sentences on the phone, I suspect she has a bronchitis, explained the difficulty in differentiating bronchitis and pneumonia without a stethoscope or chest x-ray, we will start conservatively with prednisone and azithromycin, she does not desire any cough medication and does not have any nocturnal symptoms. She can return to see [place] if not better in 5 days to a week.

## 2022-07-29 ENCOUNTER — Ambulatory Visit (INDEPENDENT_AMBULATORY_CARE_PROVIDER_SITE_OTHER): Payer: Medicare Other

## 2022-07-29 ENCOUNTER — Encounter: Payer: Self-pay | Admitting: Medical-Surgical

## 2022-07-29 ENCOUNTER — Ambulatory Visit: Payer: Medicare Other | Admitting: Medical-Surgical

## 2022-07-29 VITALS — BP 105/63 | HR 90 | Resp 20 | Ht 65.0 in | Wt 234.9 lb

## 2022-07-29 DIAGNOSIS — Z7689 Persons encountering health services in other specified circumstances: Secondary | ICD-10-CM | POA: Diagnosis not present

## 2022-07-29 DIAGNOSIS — M5441 Lumbago with sciatica, right side: Secondary | ICD-10-CM

## 2022-07-29 DIAGNOSIS — M25562 Pain in left knee: Secondary | ICD-10-CM

## 2022-07-29 DIAGNOSIS — M25561 Pain in right knee: Secondary | ICD-10-CM

## 2022-07-29 DIAGNOSIS — G8929 Other chronic pain: Secondary | ICD-10-CM

## 2022-07-29 DIAGNOSIS — M5442 Lumbago with sciatica, left side: Secondary | ICD-10-CM

## 2022-07-29 DIAGNOSIS — H9313 Tinnitus, bilateral: Secondary | ICD-10-CM

## 2022-07-29 DIAGNOSIS — I10 Essential (primary) hypertension: Secondary | ICD-10-CM

## 2022-07-29 DIAGNOSIS — Z2821 Immunization not carried out because of patient refusal: Secondary | ICD-10-CM

## 2022-07-29 DIAGNOSIS — F32A Depression, unspecified: Secondary | ICD-10-CM

## 2022-07-29 MED ORDER — OZEMPIC (0.25 OR 0.5 MG/DOSE) 2 MG/3ML ~~LOC~~ SOPN: 0.5000 mg | PEN_INJECTOR | SUBCUTANEOUS | 0 refills | Status: DC

## 2022-07-29 MED ORDER — SEMAGLUTIDE (1 MG/DOSE) 4 MG/3ML ~~LOC~~ SOPN: 1.0000 mg | PEN_INJECTOR | SUBCUTANEOUS | 0 refills | Status: DC

## 2022-07-29 MED ORDER — LOSARTAN POTASSIUM 50 MG PO TABS: 50.0000 mg | ORAL_TABLET | Freq: Every day | ORAL | 0 refills | Status: DC

## 2022-07-29 MED ORDER — TIZANIDINE HCL 4 MG PO TABS: 4.0000 mg | ORAL_TABLET | Freq: Four times a day (QID) | ORAL | 0 refills | Status: DC | PRN

## 2022-07-29 NOTE — Progress Notes (Signed)
Established Patient Office Visit  Subjective   Patient ID: Pamela Newman, female   DOB: May 01, 1956 Age: 66 y.o. MRN: [id number]   Chief Complaint  Patient presents with   Back Pain    HPI Pleasant 66 year old female presenting today for the following:  Back pain: She does have a history of sciatica that has affected her couple of times over the years but for several weeks has had bilateral lower back pain that radiates down into her both hips, thighs, and all the way into her knees.  She has had some significant weakness at times and notes that it was hard to go up stairs at one point but this has fully resolved.  The pain unfortunately has continued and she notes that she gets what she terms "hot spots" on her hips, buttocks, and posterior thighs.  These areas are tender to touch and she notes that her husband's massaging is helpful.  Is not sure where she needs to go with this but would like guidance on the next steps for further evaluation.  During all this with her back pain, she began to worry that Ozempic may be causing the issue.  She stopped the medication about 4 to 5 weeks ago and was doing okay for a while but when she started the prednisone for her recent upper respiratory infection, she notes that her hunger got out of control.  She is now interested in restarting the medication and wants to know if she can go back to her previous dose or if she needs to take small steps to prevent side effects.  Reports that she has stopped her Wellbutrin completely and has been off of it for the last couple of weeks.  She feels well overall and has not had any significant issues with anxiety or depression.  Reports that she was feeling a little to lackadaisical about things and since stopping the medication, she is feeling more like herself.  Still has this at home and notes that if she has a recurrence of symptoms, she will restart the medication but at 1/2 tablet dose.  At her last visit, we did  discuss some changes to her blood pressure medication since she had been having lightheaded episodes.  She was instructed to try reducing her losartan to 50 mg and continuing her metoprolol at 25 mg.  She has tried cutting one of her blood pressure medications in half however she is not 100% sure if she is cutting the right 1.  She would like to verify which medication should be taken half dose.  Feels that her lightheaded episodes have resolved and denies any chest pain, shortness of breath, lower extremity edema, headaches, or vision changes.  Reports that she has bilateral "buzzing" in her ears that is been going on for quite a while.  She started a supplement [place] which seems to have helped some but the buzzing never stops.  She notes that it is not significantly concerning and does not interfere with her regular activities.  There is no pain associated with her ears and she has had no further dizziness or lightheaded episodes as above.  She has done a lot of reading online and has found that there is not a whole lot that can be done about this type of thing but wanted to make sure to keep [place] updated and to see if we had any recommendations.   Objective:    Vitals:   07/29/22 1520  BP: 105/63  Pulse: 90  Resp: 20  Height: 5\' 5"  (1.651 m)  Weight: 234 lb 14.4 oz (106.5 kg)  SpO2: 98%  BMI (Calculated): 39.09    Physical Exam Vitals and nursing note reviewed.  Constitutional:      General: She is not in acute distress.    Appearance: Normal appearance. She is not ill-appearing.  HENT:     Head: Normocephalic and atraumatic.  Cardiovascular:     Rate and Rhythm: Normal rate and regular rhythm.     Pulses: Normal pulses.     Heart sounds: Normal heart sounds.  Pulmonary:     Effort: Pulmonary effort is normal. No respiratory distress.     Breath sounds: Normal breath sounds. No wheezing, rhonchi or rales.  Musculoskeletal:     Comments: Negative straight leg raise,  FABER, and FADIR bilaterally.  Some mild tenderness in the lumbar paraspinal muscles with the palpation.  Tenderness over the right hip bursa described as mild.  Skin:    General: Skin is warm and dry.  Neurological:     Mental Status: She is alert and oriented to person, place, and time.  Psychiatric:        Mood and Affect: Mood normal.        Behavior: Behavior normal.        Thought Content: Thought content normal.        Judgment: Judgment normal.   No results found for this or any previous visit (from the past 24 hour(s)).     The ASCVD Risk score (Arnett DK, et al., 2019) failed to calculate for the following reasons:   The valid total cholesterol range is 130 to 320 mg/dL   Assessment & Plan:   1. Acute bilateral low back pain with bilateral sciatica Getting lumbar spine x-rays today.  Referring to physical therapy for further evaluation.  Adding tizanidine 4 mg every 6 hours as needed for muscle spasms but recommended to use this mainly at night when she is having trouble getting comfortable and relaxing. - DG Lumbar Spine Complete; Future - tiZANidine (ZANAFLEX) 4 MG tablet; Take 1 tablet (4 mg total) by mouth every 6 (six) hours as needed for muscle spasms.  Dispense: 60 tablet; Refill: 0 - Ambulatory referral to Physical Therapy  2. Chronic pain of both knees Tizanidine as above as well as referral to physical therapy.  Getting x-rays of bilateral knees for further evaluation. - tiZANidine (ZANAFLEX) 4 MG tablet; Take 1 tablet (4 mg total) by mouth every 6 (six) hours as needed for muscle spasms.  Dispense: 60 tablet; Refill: 0 - DG Knee Complete 4 Views Right; Future - DG Knee Complete 4 Views Left; Future - Ambulatory referral to Physical Therapy  3. Encounter for weight management Recommend restarting Ozempic at 0.5 mg weekly x4 weeks then increase to 1 mg weekly x4 weeks.  After this, if tolerating well with no side effects, go ahead and increase to 2 mg weekly.   Recommend regular intentional activity as well as working on dietary modification and healthier food choices.  4. Essential hypertension Checking labs as below.  Continue Toprol-XL 25 mg daily.  Reduce losartan to 50 mg daily.  Monitor blood pressure at home with a goal of 130/80 or less.  Low-sodium diet recommended. - losartan (COZAAR) 50 MG tablet; Take 1 tablet (50 mg total) by mouth daily.  Dispense: 90 tablet; Refill: 0 - CBC with Differential/Platelet - COMPLETE METABOLIC PANEL WITH GFR - Lipid panel  5. Depression, unspecified  depression type Okay to discontinue Wellbutrin as long she does well with it.  If she does have recurrence of symptoms, she is fine to restart Wellbutrin but would like her to reach out to me because there are other doses that we can review.  Would recommend she not cut her Wellbutrin in half as it ruins the extended release nature of the medication.  6. Tinnitus of both ears In this case, her tinnitus is likely chronic in nature and it is not interfering with her daily activities.  Advised that we can get her in with ENT for further evaluation if she would like but she would like to hold off for now.  7. Influenza vaccination declined Patient aware of risk and benefits of vaccination.  Declined today.  Return in about 3 months (around 10/28/2022) for chronic disease follow up.  ___________________________________________ Thayer Ohm, DNP, APRN, FNP-BC Primary Care and Sports Medicine [facility] [place]

## 2022-07-30 LAB — CBC WITH DIFFERENTIAL/PLATELET
Absolute Monocytes: 580 cells/uL (ref 200–950)
Basophils Absolute: 69 cells/uL (ref 0–200)
Basophils Relative: 1 %
Eosinophils Absolute: 152 cells/uL (ref 15–500)
Eosinophils Relative: 2.2 %
HCT: 40 % (ref 35.0–45.0)
Hemoglobin: 13.6 g/dL (ref 11.7–15.5)
Lymphs Abs: 2491 cells/uL (ref 850–3900)
MCH: 30.4 pg (ref 27.0–33.0)
MCHC: 34 g/dL (ref 32.0–36.0)
MCV: 89.3 fL (ref 80.0–100.0)
MPV: 10.8 fL (ref 7.5–12.5)
Monocytes Relative: 8.4 %
Neutro Abs: 3609 cells/uL (ref 1500–7800)
Neutrophils Relative %: 52.3 %
Platelets: 202 10*3/uL (ref 140–400)
RBC: 4.48 10*6/uL (ref 3.80–5.10)
RDW: 13.8 % (ref 11.0–15.0)
Total Lymphocyte: 36.1 %
WBC: 6.9 10*3/uL (ref 3.8–10.8)

## 2022-07-30 LAB — COMPLETE METABOLIC PANEL WITH GFR
AG Ratio: 1.8 (calc) (ref 1.0–2.5)
ALT: 27 U/L (ref 6–29)
AST: 24 U/L (ref 10–35)
Albumin: 4.2 g/dL (ref 3.6–5.1)
Alkaline phosphatase (APISO): 66 U/L (ref 37–153)
BUN: 11 mg/dL (ref 7–25)
CO2: 25 mmol/L (ref 20–32)
Calcium: 9.7 mg/dL (ref 8.6–10.4)
Chloride: 108 mmol/L (ref 98–110)
Creat: 0.97 mg/dL (ref 0.50–1.05)
Globulin: 2.4 g/dL (calc) (ref 1.9–3.7)
Glucose, Bld: 88 mg/dL (ref 65–99)
Potassium: 4.1 mmol/L (ref 3.5–5.3)
Sodium: 143 mmol/L (ref 135–146)
Total Bilirubin: 0.6 mg/dL (ref 0.2–1.2)
Total Protein: 6.6 g/dL (ref 6.1–8.1)
eGFR: 64 mL/min/{1.73_m2} (ref >=60)

## 2022-07-30 LAB — LIPID PANEL
Cholesterol: 130 mg/dL (ref <200)
HDL: 42 mg/dL — ABNORMAL LOW (ref >=50)
LDL Cholesterol (Calc): 55 mg/dL (calc)
Non-HDL Cholesterol (Calc): 88 mg/dL (calc) (ref <130)
Total CHOL/HDL Ratio: 3.1 (calc) (ref <5.0)
Triglycerides: 311 mg/dL — ABNORMAL HIGH (ref <150)

## 2022-07-31 NOTE — Therapy (Deleted)
OUTPATIENT PHYSICAL THERAPY LUMBAR EVALUATION   Patient Name: Pamela Newman MRN: [id number] DOB:10/06/1956, 66 y.o., female Today's Date: 07/31/2022    Past Medical History:  Diagnosis Date   Allergy    Anxiety    Arthritis    Asthma    Cataract    Essential hypertension 11/21/2015   Heart disease    SMALL PROLAPSE   Hypertension    Memory loss    january 2022, 10 minutes memory loss, all tests normal and no incidence since   MVP (mitral valve prolapse)    Past Surgical History:  Procedure Laterality Date   BREAST BIOPSY Left    benign    BREAST SURGERY  2001   BREAST CYST   CATARACT EXTRACTION Bilateral    CESAREAN SECTION     COLONOSCOPY     Patient Active Problem List   Diagnosis Date Noted   Coughing 07/15/2022   Depression 08/26/2017   Elevated serum creatinine 05/28/2016   Hyperlipidemia 11/21/2015   Postmenopause 11/21/2015   Symptomatic menopausal or female climacteric states 03/26/2010   Essential hypertension 03/22/2010    PCP: Samuel Bouche, NP  REFERRING PROVIDER: Samuel Bouche, NP  REFERRING DIAG: Acute bilat LBP with bilat sciatica; chronic bilat knee pain   Rationale for Evaluation and Treatment Rehabilitation  THERAPY DIAG:  No diagnosis found.  ONSET DATE: ***  SUBJECTIVE:                                                                                                                                                                                           SUBJECTIVE STATEMENT: *** PERTINENT HISTORY:  Back pain: She does have a history of sciatica that has affected her couple of times over the years but for several weeks has had bilateral lower back pain that radiates down into her both hips, thighs, and all the way into her knees. History of HTN; depression; obesity   PAIN:  Are you having pain? Yes: NPRS scale: ***/10 Pain location: *** Pain description: *** Aggravating factors: *** Relieving factors: ***   PRECAUTIONS:  None  WEIGHT BEARING RESTRICTIONS No  FALLS:  Has patient fallen in last 6 months? No  LIVING ENVIRONMENT: Lives with: {OPRC lives with:25569::"lives with their family"} Lives in: {Lives in:25570} Stairs: {opstairs:27293} Has following equipment at home: {Assistive devices:23999}  OCCUPATION: ***  PLOF: Independent  PATIENT GOALS ***   OBJECTIVE:   DIAGNOSTIC FINDINGS:  Xrays 07/29/22:  Mild-to-moderate tricompartmental degenerative changes in the knees bilaterally; multilevel degenerative changes in the lumbar spine. Bilat knees - Mild-to-moderate tricompartmental degenerative changes in the knees bilaterally.  PATIENT SURVEYS:  FOTO ***  COGNITION:  Overall cognitive status: Within functional limits for tasks assessed     SENSATION: {sensation:27233}  MUSCLE LENGTH: Hamstrings: Right *** deg; Left *** deg Marcello Moores test: Right *** deg; Left *** deg  POSTURE: {posture:25561}  PALPATION: ***  LUMBAR ROM:   Active  A/PROM  eval  Flexion   Extension   Right lateral flexion   Left lateral flexion   Right rotation   Left rotation    (Blank rows = not tested)  LOWER EXTREMITY ROM:     Active  Right eval Left eval  Hip flexion    Hip extension    Hip abduction    Hip adduction    Hip internal rotation    Hip external rotation    Knee flexion    Knee extension    Ankle dorsiflexion    Ankle plantarflexion    Ankle inversion    Ankle eversion     (Blank rows = not tested)  LOWER EXTREMITY MMT:    MMT Right eval Left eval  Hip flexion    Hip extension    Hip abduction    Hip adduction    Hip internal rotation    Hip external rotation    Knee flexion    Knee extension    Ankle dorsiflexion    Ankle plantarflexion    Ankle inversion    Ankle eversion     (Blank rows = not tested)  LUMBAR SPECIAL TESTS:  Straight leg raise test: Slump test:  FUNCTIONAL TESTS:  5 times sit to stand:   GAIT: Distance walked: *** Assistive device  utilized: {Assistive devices:23999} Level of assistance: {Levels of assistance:24026} Comments: ***    TODAY'S TREATMENT  ***   PATIENT EDUCATION:  Education details: POC; HEP Person educated: Patient Education method: Consulting civil engineer, Media planner, Corporate treasurer cues, Verbal cues, and Handouts Education comprehension: verbalized understanding, returned demonstration, verbal cues required, tactile cues required, and needs further education   HOME EXERCISE PROGRAM: ***  ASSESSMENT:  CLINICAL IMPRESSION: Patient is a 66 y.o.female who was seen today for physical therapy evaluation and treatment for acute bilat LBP with bilat sciatica and chronic bilat knee pain.    OBJECTIVE IMPAIRMENTS Abnormal gait, decreased activity tolerance, decreased balance, decreased endurance, decreased mobility, decreased ROM, decreased strength, increased fascial restrictions, impaired flexibility, improper body mechanics, postural dysfunction, obesity, and pain.   ACTIVITY LIMITATIONS lifting, bending, sitting, standing, squatting, transfers, and locomotion level  PARTICIPATION LIMITATIONS: cleaning, laundry, community activity, and household chores  [place], Past/current experiences, Time since onset of injury/illness/exacerbation, and 3+ comorbidities: history of LBP; HTN; depression; obesity are also affecting patient's functional outcome.   REHAB POTENTIAL: Good  CLINICAL DECISION MAKING: Stable/uncomplicated  EVALUATION COMPLEXITY: Low   GOALS: Goals reviewed with patient? Yes  SHORT TERM GOALS: Target date: 09/01/2022  Independent in initial HEP Baseline: Goal status: INITIAL  2.  *** Baseline:  Goal status: INITIAL    LONG TERM GOALS: Target date: 09/29/22  *** Baseline:  Goal status: INITIAL  2.  *** Baseline:  Goal status: INITIAL  3.  *** Baseline:  Goal status: INITIAL  4.  *** Baseline:  Goal status: INITIAL  5.  *** Baseline:  Goal status:  INITIAL  6.  *** Baseline:  Goal status: INITIAL   PLAN: PT FREQUENCY: 2x/week  PT DURATION: 8 weeks  PLANNED INTERVENTIONS: Therapeutic exercises, Therapeutic activity, Neuromuscular re-education, Balance training, Gait training, Patient/Family education, Joint mobilization, Aquatic Therapy, Dry Needling, Electrical stimulation, Spinal mobilization, Cryotherapy, Moist heat, Taping, Ultrasound, Ionotophoresis  4mg /ml Dexamethasone, Manual therapy, and Re-evaluation.  PLAN FOR NEXT SESSION: Review and progress with therapeutic exercise program; manual work and modalities as indicated   Kyrillos Adams Nilda Simmer, PT, MPH  07/31/2022, 4:15 PM

## 2022-08-04 ENCOUNTER — Ambulatory Visit: Payer: Medicare Other | Admitting: Rehabilitative and Restorative Service Providers"

## 2022-08-05 ENCOUNTER — Other Ambulatory Visit: Payer: Self-pay | Admitting: Medical-Surgical

## 2022-08-05 DIAGNOSIS — G8929 Other chronic pain: Secondary | ICD-10-CM

## 2022-08-05 DIAGNOSIS — M5441 Lumbago with sciatica, right side: Secondary | ICD-10-CM

## 2022-08-06 ENCOUNTER — Ambulatory Visit: Payer: Medicare Other | Attending: Medical-Surgical | Admitting: Physical Therapy

## 2022-08-06 ENCOUNTER — Other Ambulatory Visit: Payer: Self-pay

## 2022-08-06 ENCOUNTER — Encounter: Payer: Self-pay | Admitting: Physical Therapy

## 2022-08-06 DIAGNOSIS — M5441 Lumbago with sciatica, right side: Secondary | ICD-10-CM | POA: Diagnosis not present

## 2022-08-06 DIAGNOSIS — G8929 Other chronic pain: Secondary | ICD-10-CM | POA: Diagnosis not present

## 2022-08-06 DIAGNOSIS — M25562 Pain in left knee: Secondary | ICD-10-CM | POA: Diagnosis not present

## 2022-08-06 DIAGNOSIS — R29898 Other symptoms and signs involving the musculoskeletal system: Secondary | ICD-10-CM

## 2022-08-06 DIAGNOSIS — M6281 Muscle weakness (generalized): Secondary | ICD-10-CM

## 2022-08-06 DIAGNOSIS — M25561 Pain in right knee: Secondary | ICD-10-CM | POA: Insufficient documentation

## 2022-08-06 DIAGNOSIS — M5442 Lumbago with sciatica, left side: Secondary | ICD-10-CM | POA: Diagnosis not present

## 2022-08-06 NOTE — Therapy (Signed)
OUTPATIENT PHYSICAL THERAPY THORACOLUMBAR EVALUATION   Patient Name: Pamela Newman MRN: [id number] DOB:06-Oct-1956, 66 y.o., female Today's Date: 08/06/2022   PT End of Session - 08/06/22 1455     Visit Number 1    Number of Visits 6    Date for PT Re-Evaluation 09/17/22    Authorization Type Medicare    Authorization - Visit Number 1    Progress Note Due on Visit 10    PT Start Time 1400    PT Stop Time 1445    PT Time Calculation (min) 45 min    Activity Tolerance Patient tolerated treatment well    Behavior During Therapy [facility] for tasks assessed/performed             Past Medical History:  Diagnosis Date   Allergy    Anxiety    Arthritis    Asthma    Cataract    Essential hypertension 11/21/2015   Heart disease    SMALL PROLAPSE   Hypertension    Memory loss    january 2022, 10 minutes memory loss, all tests normal and no incidence since   MVP (mitral valve prolapse)    Past Surgical History:  Procedure Laterality Date   BREAST BIOPSY Left    benign    BREAST SURGERY  2001   BREAST CYST   CATARACT EXTRACTION Bilateral    CESAREAN SECTION     COLONOSCOPY     Patient Active Problem List   Diagnosis Date Noted   Coughing 07/15/2022   Depression 08/26/2017   Elevated serum creatinine 05/28/2016   Hyperlipidemia 11/21/2015   Postmenopause 11/21/2015   Symptomatic menopausal or female climacteric states 03/26/2010   Essential hypertension 03/22/2010      REFERRING PROVIDER: Christen Butter  REFERRING DIAG: LBP with sciatica bilaterally, bilateral knee pain  Rationale for Evaluation and Treatment Rehabilitation  THERAPY DIAG:  Left knee pain, unspecified chronicity  Right knee pain, unspecified chronicity  Muscle weakness (generalized)  Other symptoms and signs involving the musculoskeletal system  ONSET DATE: July 2023  SUBJECTIVE:                                                                                                                                                                                            SUBJECTIVE STATEMENT: Pt has been having back and knee pain for the past 2 months. She states the pain got better and is now getting worse again. Pt has pain from the low back to her feet, some numbness on outside of legs. Pain increases with prolonged walking and standing. Pain decreases with use of muscle relaxers. PERTINENT HISTORY:  Lt shoulder injection 10 years ago  PAIN:  Are you having pain? Yes: NPRS scale: 2/10 Pain location: both knees Pain description: "twinges" Aggravating factors: prolonged standing, walking Relieving factors: meds   PRECAUTIONS: None  WEIGHT BEARING RESTRICTIONS No  FALLS:  Has patient fallen in last 6 months? No   OCCUPATION: retired  PLOF: Independent  PATIENT GOALS reduce pain, learn exercises to keep it from flaring up   OBJECTIVE:   DIAGNOSTIC FINDINGS:  Arthritic changes bilat knees and lumbar spine  COGNITION:  Overall cognitive status: Within functional limits for tasks assessed     SENSATION: Pt reports some tingling in lateral legs and feet  MUSCLE LENGTH: Hamstrings: decreased bilat   POSTURE:  decreased Rt knee extension in standing  PALPATION: TTP L4/5 with CPAs, TTP SIJ PAs, hypomobile lumbar and SIJ  LUMBAR ROM:   Active  A/PROM  eval  Flexion WFL  Extension Limited 50% - pain  Right lateral flexion WFL  Left lateral flexion WFL  Right rotation WFL  Left rotation WFL   (Blank rows = not tested)  LOWER EXTREMITY ROM:     Active  Right eval Left eval  Hip flexion    Hip extension    Hip abduction    Hip adduction    Hip internal rotation    Hip external rotation    Knee flexion 125 125  Knee extension -6 0  Ankle dorsiflexion    Ankle plantarflexion    Ankle inversion    Ankle eversion     (Blank rows = not tested)  LOWER EXTREMITY MMT:    MMT Right eval Left eval  Hip flexion 4 4  Hip extension    Hip abduction 4+  4+  Hip adduction    Hip internal rotation    Hip external rotation    Knee flexion 4 4+  Knee extension 4+ 4+  Ankle dorsiflexion    Ankle plantarflexion    Ankle inversion    Ankle eversion     (Blank rows = not tested)  LUMBAR SPECIAL TESTS:  Straight leg raise test: Negative and FABER test: Negative   TODAY'S TREATMENT  HS stretch supine x 30 sec bilat Bridge x 5 Quad set Rt LE 3 x 3 sec Posterior pelvic tilt x 5 supine Supine transverse ab activation 5 x 3 sec Sit to stand 5#KB x 5 Side step red TB x 5  Hip ext red TB x 5   PATIENT EDUCATION:  Education details: PT POC and goals Person educated: Patient Education method: Explanation, Demonstration, and Handouts Education comprehension: verbalized understanding and returned demonstration   HOME EXERCISE PROGRAM: Access Code: [email or web address] URL: [email or web address] Date: 08/06/2022 Prepared by: Isabelle Course  Exercises - Supine Quad Set  - 1 x daily - 3-4 x weekly - 2 sets - 10 reps - 3 sec hold - Supine Bridge  - 1 x daily - 3-4 x weekly - 2 sets - 10 reps - Hooklying Hamstring Stretch with Strap  - 1 x daily - 7 x weekly - 1 sets - 3 reps - 20-30 seconds hold - Supine Posterior Pelvic Tilt  - 1 x daily - 7 x weekly - 2 sets - 10 reps - 3 seconds hold - Seated Transversus Abdominis Bracing  - 1 x daily - 7 x weekly - 1 sets - 10 reps - 3-5 seconds hold - Side Stepping with Resistance at Ankles  - 1 x daily - 7 x weekly - 2 sets -  10 reps - Hip Extension with Resistance Loop  - 1 x daily - 7 x weekly - 2 sets - 10 reps - Sit to Stand Without Arm Support  - 1 x daily - 7 x weekly - 2 sets - 10 reps  ASSESSMENT:  CLINICAL IMPRESSION: Patient is a 66 y.o. female who was seen today for physical therapy evaluation and treatment for low back pain with sciatica and bilateral knee pain. Pt presents with decreased core and LE strength, decreased flexibility, decreased activity tolerance, increased pain and  will benefit from skilled PT to address deficits and improve functional mobility and activity tolerance.    OBJECTIVE IMPAIRMENTS decreased activity tolerance, decreased mobility, decreased strength, hypomobility, impaired flexibility, and pain.   ACTIVITY LIMITATIONS standing and locomotion level  PARTICIPATION LIMITATIONS: community activity  PERSONAL FACTORS Age and Time since onset of injury/illness/exacerbation are also affecting patient's functional outcome.   REHAB POTENTIAL: Good  CLINICAL DECISION MAKING: Evolving/moderate complexity  EVALUATION COMPLEXITY: Moderate   GOALS: Goals reviewed with patient? Yes   LONG TERM GOALS: Target date: 09/17/2022  Pt will be independent with HEP for land and aquatics Baseline:  Goal status: INITIAL  2.  Pt will tolerate walking x 30 minutes on a variety of surfaces with pain <= 1/10 Baseline:  Goal status: INITIAL  3.  Pt will improve bilat LE strength to 4+/5 to tolerate standing and walking with decreased pain Baseline:  Goal status: INITIAL     PLAN: PT FREQUENCY: 1x/week  PT DURATION: 6 weeks  PLANNED INTERVENTIONS: Therapeutic exercises, Therapeutic activity, Neuromuscular re-education, Balance training, Gait training, Patient/Family education, Self Care, Joint mobilization, Aquatic Therapy, Dry Needling, Electrical stimulation, Cryotherapy, Moist heat, Taping, Ionotophoresis 4mg /ml Dexamethasone, Manual therapy, and Re-evaluation.  PLAN FOR NEXT SESSION: assess and progress HEP for functional knee and core strength   Thompson Mckim, PT 08/06/2022, 2:56 PM

## 2022-08-08 ENCOUNTER — Encounter: Payer: Self-pay | Admitting: Sports Medicine

## 2022-08-08 ENCOUNTER — Ambulatory Visit: Payer: Medicare Other | Admitting: Sports Medicine

## 2022-08-08 DIAGNOSIS — M48061 Spinal stenosis, lumbar region without neurogenic claudication: Secondary | ICD-10-CM | POA: Diagnosis not present

## 2022-08-08 DIAGNOSIS — M170 Bilateral primary osteoarthritis of knee: Secondary | ICD-10-CM | POA: Insufficient documentation

## 2022-08-08 MED ORDER — CELECOXIB 200 MG PO CAPS: ORAL_CAPSULE | ORAL | 2 refills | Status: DC

## 2022-08-08 NOTE — Assessment & Plan Note (Signed)
This is a pleasant 66 year old female, she has bilateral knee pain, known osteoarthritis on x-rays, she is doing okay with Tylenol. She has started a weight loss regimen with her PCP, she is also doing formal physical therapy, she has been extremely well managed so far. She still has a bit of pain, has not tried NSAIDs. Most recent renal function and GFR were normal so we will start Celebrex 1-2 tabs daily. Turn to see me in about 6 weeks and if insufficient improvement we will consider injections.

## 2022-08-08 NOTE — Progress Notes (Signed)
    Procedures performed today:    None.  Independent interpretation of notes and tests performed by another provider:   None.  Brief History, Exam, Impression, and Recommendations:    Primary osteoarthritis of both knees This is a pleasant 66 year old female, she has bilateral knee pain, known osteoarthritis on x-rays, she is doing okay with Tylenol. She has started a weight loss regimen with her PCP, she is also doing formal physical therapy, she has been extremely well managed so far. She still has a bit of pain, has not tried NSAIDs. Most recent renal function and GFR were normal so we will start Celebrex 1-2 tabs daily. Turn to see me in about 6 weeks and if insufficient improvement we will consider injections.  Degenerative lumbar spinal stenosis Shirleyann also has chronic axial low back pain, occasional radicular symptoms down the left leg to the outer toe. A little bit better with forward flexion when walking and shopping. This is all consistent with lumbar spinal stenosis. As above she has been extremely well managed by her PCP with formal physical therapy, weight loss, and occasional muscle relaxer. X-rays were already done, thank you for those. The only thing I would change is adding some Celebrex as her renal function was normal, she can get her renal function checked every 6 months to a year, return to see me in about 6 weeks and if insufficient improvement we will proceed with MRI for interventional planning.    ____________________________________________ Gwen Her. Dianah Field, M.D., ABFM., CAQSM., AME. Primary Care and Sports Medicine  [facility]  Adjunct Professor of [place] [employer] [facility] [employer]  Risk manager

## 2022-08-08 NOTE — Assessment & Plan Note (Signed)
Pamela Newman also has chronic axial low back pain, occasional radicular symptoms down the left leg to the outer toe. A little bit better with forward flexion when walking and shopping. This is all consistent with lumbar spinal stenosis. As above she has been extremely well managed by her PCP with formal physical therapy, weight loss, and occasional muscle relaxer. X-rays were already done, thank you for those. The only thing I would change is adding some Celebrex as her renal function was normal, she can get her renal function checked every 6 months to a year, return to see me in about 6 weeks and if insufficient improvement we will proceed with MRI for interventional planning.

## 2022-08-13 ENCOUNTER — Ambulatory Visit: Payer: Medicare Other | Admitting: Physical Therapy

## 2022-08-14 ENCOUNTER — Ambulatory Visit: Payer: Medicare Other | Admitting: Sports Medicine

## 2022-08-14 ENCOUNTER — Encounter: Payer: Self-pay | Admitting: Sports Medicine

## 2022-08-14 DIAGNOSIS — F419 Anxiety disorder, unspecified: Secondary | ICD-10-CM

## 2022-08-14 DIAGNOSIS — F32A Depression, unspecified: Secondary | ICD-10-CM | POA: Diagnosis not present

## 2022-08-14 NOTE — Progress Notes (Signed)
    Procedures performed today:    None.  Independent interpretation of notes and tests performed by another provider:   None.  Brief History, Exam, Impression, and Recommendations:    Anxiety and depression This is a very pleasant 66 year old female, she does have a history of depression on Wellbutrin, we started Celebrex, she recalls taking it, and then later that night waking up feeling extremely anxious, she then felt a sensation of flushing, her heart racing. She checked her vitals and everything was normal. She never developed a rash, swelling. I think she had more of a panic attack rather than an actual reaction to Celebrex which would be more swelling and a rash. She should restart Celebrex, I think she is going to start at a half tab daily for 2 weeks and then go up to a full tab daily if needed, and keep existing follow-up with me.    ____________________________________________ Gwen Her. Dianah Field, M.D., ABFM., CAQSM., AME. Primary Care and Sports Medicine [facility] [facility]  Adjunct Professor of [place] [employer] [facility] of Medicine  Risk manager

## 2022-08-14 NOTE — Assessment & Plan Note (Signed)
This is a very pleasant 66 year old female, she does have a history of depression on Wellbutrin, we started Celebrex, she recalls taking it, and then later that night waking up feeling extremely anxious, she then felt a sensation of flushing, her heart racing. She checked her vitals and everything was normal. She never developed a rash, swelling. I think she had more of a panic attack rather than an actual reaction to Celebrex which would be more swelling and a rash. She should restart Celebrex, I think she is going to start at a half tab daily for 2 weeks and then go up to a full tab daily if needed, and keep existing follow-up with me.

## 2022-08-29 ENCOUNTER — Encounter: Payer: Medicare Other | Admitting: Physical Therapy

## 2022-09-05 ENCOUNTER — Ambulatory Visit (HOSPITAL_BASED_OUTPATIENT_CLINIC_OR_DEPARTMENT_OTHER): Payer: Medicare Other | Admitting: Physical Therapy

## 2022-09-11 ENCOUNTER — Ambulatory Visit: Payer: Medicare Other | Admitting: Medical-Surgical

## 2022-09-12 ENCOUNTER — Ambulatory Visit: Payer: Medicare Other | Admitting: Medical-Surgical

## 2022-09-17 ENCOUNTER — Ambulatory Visit: Payer: Medicare Other | Admitting: Sports Medicine

## 2022-09-29 ENCOUNTER — Other Ambulatory Visit: Payer: Self-pay | Admitting: Medical-Surgical

## 2022-09-30 ENCOUNTER — Ambulatory Visit: Payer: Medicare Other | Admitting: Sports Medicine

## 2022-09-30 ENCOUNTER — Encounter: Payer: Self-pay | Admitting: Sports Medicine

## 2022-09-30 ENCOUNTER — Ambulatory Visit (INDEPENDENT_AMBULATORY_CARE_PROVIDER_SITE_OTHER): Payer: Medicare Other

## 2022-09-30 DIAGNOSIS — M48061 Spinal stenosis, lumbar region without neurogenic claudication: Secondary | ICD-10-CM

## 2022-09-30 DIAGNOSIS — M170 Bilateral primary osteoarthritis of knee: Secondary | ICD-10-CM | POA: Diagnosis not present

## 2022-09-30 MED ORDER — TRIAMCINOLONE ACETONIDE 40 MG/ML IJ SUSP
80.0000 mg | Freq: Once | INTRAMUSCULAR | Status: AC
  Administered 2022-09-30: 80 mg via INTRAMUSCULAR

## 2022-09-30 MED ORDER — TRIAZOLAM 0.25 MG PO TABS: ORAL_TABLET | ORAL | 0 refills | Status: DC

## 2022-09-30 NOTE — Assessment & Plan Note (Signed)
X-ray confirmed bilateral knee osteoarthritis, a little better with Celebrex but desiring injection, injected both knees today, return to see me in 6 weeks.

## 2022-09-30 NOTE — Assessment & Plan Note (Addendum)
Chronic axial low back pain with occasional radicular symptoms down the left leg to the outer toe, better with forward flexion, she has been well-managed with her PCP with formal PT, weight loss and occasional muscle relaxers, x-rays were helpful, she just increased her Celebrex to twice daily, still having pain in spite of greater than 6 weeks of physician directed conservative treatments we will proceed with MRI, with triazolam preprocedural anxiolysis, we will hold off on ordering the epidural however.

## 2022-09-30 NOTE — Addendum Note (Signed)
Addended by: Carren Rang A on: 09/30/2022 02:12 PM   Modules accepted: Orders

## 2022-09-30 NOTE — Progress Notes (Signed)
    Procedures performed today:    Procedure: Real-time Ultrasound Guided injection of the left knee Device: Samsung HS60  Verbal informed consent obtained.  Time-out conducted.  Noted no overlying erythema, induration, or other signs of local infection.  Skin prepped in a sterile fashion.  Local anesthesia: Topical Ethyl chloride.  With sterile technique and under real time ultrasound guidance: Mild effusion noted, 1 cc Kenalog 40, 2 cc lidocaine, 2 cc bupivacaine injected easily Completed without difficulty  Advised to call if fevers/chills, erythema, induration, drainage, or persistent bleeding.  Images permanently stored and available for review in PACS.  Impression: Technically successful ultrasound guided injection.   Procedure: Real-time Ultrasound Guided injection of the right knee Device: Samsung HS60  Verbal informed consent obtained.  Time-out conducted.  Noted no overlying erythema, induration, or other signs of local infection.  Skin prepped in a sterile fashion.  Local anesthesia: Topical Ethyl chloride.  With sterile technique and under real time ultrasound guidance: Mild effusion noted, 1 cc Kenalog 40, 2 cc lidocaine, 2 cc bupivacaine injected easily Completed without difficulty  Advised to call if fevers/chills, erythema, induration, drainage, or persistent bleeding.  Images permanently stored and available for review in PACS.  Impression: Technically successful ultrasound guided injection.  Independent interpretation of notes and tests performed by another provider:   None.  Brief History, Exam, Impression, and Recommendations:    Primary osteoarthritis of both knees X-ray confirmed bilateral knee osteoarthritis, a little better with Celebrex but desiring injection, injected both knees today, return to see me in 6 weeks.  Degenerative lumbar spinal stenosis Chronic axial low back pain with occasional radicular symptoms down the left leg to the outer toe,  better with forward flexion, she has been well-managed with her PCP with formal PT, weight loss and occasional muscle relaxers, x-rays were helpful, she just increased her Celebrex to twice daily, still having pain in spite of greater than 6 weeks of physician directed conservative treatments we will proceed with MRI, with triazolam preprocedural anxiolysis, we will hold off on ordering the epidural however.  Chronic process with exacerbation and pharmacologic intervention  ____________________________________________ Ihor Austin. Benjamin Stain, M.D., ABFM., CAQSM., AME. Primary Care and Sports Medicine [place] [facility]  Adjunct Professor of Family Medicine  [employer] [employer] [facility] [employer]  Restaurant manager, fast food

## 2022-10-11 ENCOUNTER — Other Ambulatory Visit: Payer: Medicare Other

## 2022-10-14 ENCOUNTER — Ambulatory Visit (INDEPENDENT_AMBULATORY_CARE_PROVIDER_SITE_OTHER): Payer: Medicare Other

## 2022-10-14 DIAGNOSIS — M48061 Spinal stenosis, lumbar region without neurogenic claudication: Secondary | ICD-10-CM | POA: Diagnosis not present

## 2022-10-14 DIAGNOSIS — M545 Low back pain, unspecified: Secondary | ICD-10-CM | POA: Diagnosis not present

## 2022-10-23 ENCOUNTER — Other Ambulatory Visit: Payer: Self-pay | Admitting: Medical-Surgical

## 2022-11-05 ENCOUNTER — Encounter: Payer: Self-pay | Admitting: Medical-Surgical

## 2022-11-05 DIAGNOSIS — E6609 Other obesity due to excess calories: Secondary | ICD-10-CM | POA: Insufficient documentation

## 2022-11-10 ENCOUNTER — Ambulatory Visit: Payer: Medicare Other | Admitting: Medical-Surgical

## 2022-11-11 ENCOUNTER — Ambulatory Visit: Payer: Medicare Other | Admitting: Sports Medicine

## 2022-11-17 ENCOUNTER — Other Ambulatory Visit: Payer: Self-pay | Admitting: Medical-Surgical

## 2022-11-21 ENCOUNTER — Other Ambulatory Visit: Payer: Self-pay | Admitting: Medical-Surgical

## 2022-11-25 ENCOUNTER — Other Ambulatory Visit: Payer: Self-pay | Admitting: Sports Medicine

## 2022-11-25 DIAGNOSIS — M170 Bilateral primary osteoarthritis of knee: Secondary | ICD-10-CM

## 2022-12-15 ENCOUNTER — Encounter: Payer: Self-pay | Admitting: Medical-Surgical

## 2022-12-15 ENCOUNTER — Ambulatory Visit (INDEPENDENT_AMBULATORY_CARE_PROVIDER_SITE_OTHER): Payer: Medicare Other | Admitting: Medical-Surgical

## 2022-12-15 VITALS — BP 110/70 | HR 84 | Resp 20 | Ht 65.0 in | Wt 234.0 lb

## 2022-12-15 DIAGNOSIS — R002 Palpitations: Secondary | ICD-10-CM

## 2022-12-15 DIAGNOSIS — F32A Depression, unspecified: Secondary | ICD-10-CM

## 2022-12-15 DIAGNOSIS — M48061 Spinal stenosis, lumbar region without neurogenic claudication: Secondary | ICD-10-CM

## 2022-12-15 DIAGNOSIS — I10 Essential (primary) hypertension: Secondary | ICD-10-CM

## 2022-12-15 DIAGNOSIS — F419 Anxiety disorder, unspecified: Secondary | ICD-10-CM

## 2022-12-15 DIAGNOSIS — Z1231 Encounter for screening mammogram for malignant neoplasm of breast: Secondary | ICD-10-CM

## 2022-12-15 DIAGNOSIS — E7849 Other hyperlipidemia: Secondary | ICD-10-CM | POA: Diagnosis not present

## 2022-12-15 MED ORDER — OZEMPIC (2 MG/DOSE) 8 MG/3ML ~~LOC~~ SOPN: PEN_INJECTOR | SUBCUTANEOUS | 1 refills | Status: DC

## 2022-12-15 NOTE — Progress Notes (Signed)
Established Patient Office Visit  Subjective   Patient ID: Pamela Newman, female   DOB: Nov 30, 1955 Age: 67 y.o. MRN: [id number]   Chief Complaint  Patient presents with   Follow-up   Osteoarthritis   HPI Pleasant 67 year old female presenting today for the following:   Weight: taking Semaglutide 77m weekly, tolerating well without side effects. Feels the medication is still helping with her weight but thinks the dose needs to go up to the 230mfor better results.   HLD: taking Lipitor 4024maily, tolerating well without side effects.   Mood: taking Wellbutrin 150m71mily, tolerating well without side effects. Happy with the effects of the medication. No SI/HI.  HTN: taking Toprol-XL 25mg9mly and Losartan 50mg 61my, tolerating well without side effects. Following a low sodium diet. Denies CP, SOB, palpitations, lower extremity edema, dizziness, headaches, or vision changes.  Arthritis: seeing Dr. T for Darene Lamerhe chronic pains associated with arthritis. Has questions for me about taking Celebrex since she has taken some time to read about the medication and the potential side effects. Wants to know if it is safe to take long term.    Objective:    Vitals:   12/15/22 1521  BP: 110/70  Pulse: 84  Resp: 20  Height: 5' 5"$  (1.651 m)  Weight: 234 lb (106.1 kg)  SpO2: 94%  BMI (Calculated): 38.94    Physical Exam Vitals and nursing note reviewed.  Constitutional:      General: She is not in acute distress.    Appearance: Normal appearance. She is obese. She is not ill-appearing.  HENT:     Head: Normocephalic and atraumatic.  Cardiovascular:     Rate and Rhythm: Normal rate and regular rhythm.     Pulses: Normal pulses.     Heart sounds: Normal heart sounds.  Pulmonary:     Effort: Pulmonary effort is normal. No respiratory distress.     Breath sounds: Normal breath sounds. No wheezing, rhonchi or rales.  Skin:    General: Skin is warm and dry.  Neurological:     Mental  Status: She is alert and oriented to person, place, and time.  Psychiatric:        Mood and Affect: Mood normal.        Behavior: Behavior normal.        Thought Content: Thought content normal.        Judgment: Judgment normal.   No results found for this or any previous visit (from the past 24 hour(s)).     The 10-year ASCVD risk score (Arnett DK, et al., 2019) is: 7%   Values used to calculate the score:     Age: 9 yea34     Sex: Female     Is Non-Hispanic African American: No     Diabetic: No     Tobacco smoker: No     Systolic Blood Pressure: 110 [id number]    Is BP treated: Yes     HDL Cholesterol: 42 mg/dL     Total Cholesterol: 130 mg/dL   Assessment & Plan:   1. Essential hypertension 2. Palpitations BP looks great today. Continue Losartan 50mg d86m and Toprol-XL 25mg da56m Continue a low sodium diet with regular intentional exercise with a goal for weight loss to a healthy weight. Monitor BP at home with a goal of 130/80 or less.   3. Other hyperlipidemia Continue Lipitor 40mg dai34m  4. Anxiety and depression Stable. Continue Welbutrin 150mg dail20m 5.  Degenerative lumbar spinal stenosis Reviewed Celebrex, dosing, potential side effects, and recommended monitoring. Kidney function is normal and no history of GI issues. Feel that this medication is safe for her to use. Recommend watching for any side effects that may arise but ok to take the medication as prescribed.   6. Encounter for screening mammogram for malignant neoplasm of breast Mammogram ordered.  - MM DIGITAL SCREENING BILATERAL; Future  Return in about 6 months (around 06/15/2023) for chronic disease follow up.  ___________________________________________ Clearnce Sorrel, DNP, APRN, FNP-BC Primary Care and [place]

## 2022-12-24 ENCOUNTER — Other Ambulatory Visit: Payer: Self-pay | Admitting: Medical-Surgical

## 2022-12-27 ENCOUNTER — Other Ambulatory Visit: Payer: Self-pay | Admitting: Medical-Surgical

## 2022-12-27 DIAGNOSIS — I10 Essential (primary) hypertension: Secondary | ICD-10-CM

## 2022-12-29 ENCOUNTER — Ambulatory Visit: Payer: Medicare Other | Admitting: Sports Medicine

## 2022-12-30 ENCOUNTER — Ambulatory Visit: Payer: Medicare Other | Admitting: Sports Medicine

## 2023-01-05 ENCOUNTER — Ambulatory Visit: Payer: Medicare Other | Admitting: Sports Medicine

## 2023-01-05 DIAGNOSIS — M170 Bilateral primary osteoarthritis of knee: Secondary | ICD-10-CM | POA: Diagnosis not present

## 2023-01-05 DIAGNOSIS — M48061 Spinal stenosis, lumbar region without neurogenic claudication: Secondary | ICD-10-CM | POA: Diagnosis not present

## 2023-01-05 NOTE — Progress Notes (Signed)
    Procedures performed today:    None.  Independent interpretation of notes and tests performed by another provider:   None.  Brief History, Exam, Impression, and Recommendations:    Primary osteoarthritis of both knees Very pleasant 67 year old female, x-ray confirmed osteoarthritis, Celebrex twice daily has improved pain significantly, we did an injection into both knees at the last visit, she returns today mostly pain-free. I encouraged her to continue with bilateral straight leg raises every day. Viscosupplementation remains an option  Degenerative lumbar spinal stenosis Multilevel lumbar degenerative disc disease, central canal stenosis L4-L5, axial back pain which historically was better with forward flexion and had radicular symptoms down the left leg to the outer toe has improved dramatically, she could get more consistent with her home and core conditioning. She plans to do this, and if insufficient improvement after 6 weeks we will consider a left-sided lumbar epidural.    ____________________________________________ Gwen Her. Dianah Field, M.D., ABFM., CAQSM., AME. Primary Care and Sports Medicine [place] [facility]  Adjunct Professor of [employer] [facility] [employer] Medicine  Risk manager

## 2023-01-05 NOTE — Assessment & Plan Note (Signed)
Multilevel lumbar degenerative disc disease, central canal stenosis L4-L5, axial back pain which historically was better with forward flexion and had radicular symptoms down the left leg to the outer toe has improved dramatically, she could get more consistent with her home and core conditioning. She plans to do this, and if insufficient improvement after 6 weeks we will consider a left-sided lumbar epidural.

## 2023-01-05 NOTE — Assessment & Plan Note (Signed)
Very pleasant 67 year old female, x-ray confirmed osteoarthritis, Celebrex twice daily has improved pain significantly, we did an injection into both knees at the last visit, she returns today mostly pain-free. I encouraged her to continue with bilateral straight leg raises every day. Viscosupplementation remains an option

## 2023-01-19 ENCOUNTER — Telehealth: Payer: Self-pay | Admitting: Medical-Surgical

## 2023-01-19 NOTE — Telephone Encounter (Signed)
Called patient to schedule Medicare Annual Wellness Visit (AWV). Left message for patient to call back and schedule Medicare Annual Wellness Visit (AWV).  Last date of AWV: 2022  Please schedule an appointment at any time with Nurse health Advisor.  If any questions, please contact me at [phone].  Thank you ,  Lin Givens Patient Access Advocate II Direct Dial: [phone]

## 2023-01-24 ENCOUNTER — Other Ambulatory Visit: Payer: Self-pay | Admitting: Medical-Surgical

## 2023-01-24 DIAGNOSIS — I10 Essential (primary) hypertension: Secondary | ICD-10-CM

## 2023-03-01 ENCOUNTER — Other Ambulatory Visit: Payer: Self-pay | Admitting: Sports Medicine

## 2023-03-01 DIAGNOSIS — M170 Bilateral primary osteoarthritis of knee: Secondary | ICD-10-CM

## 2023-03-30 ENCOUNTER — Other Ambulatory Visit: Payer: Self-pay | Admitting: Medical-Surgical

## 2023-04-08 ENCOUNTER — Telehealth: Payer: Self-pay | Admitting: Medical-Surgical

## 2023-04-08 NOTE — Telephone Encounter (Signed)
Pharmacy called waiting for authorization for Ozempic. Please call pt, she has some concerns

## 2023-04-13 NOTE — Telephone Encounter (Signed)
Pt called. She is following up on PA for Ozempic.

## 2023-04-14 ENCOUNTER — Other Ambulatory Visit: Payer: Self-pay | Admitting: Medical-Surgical

## 2023-04-16 ENCOUNTER — Encounter: Payer: Self-pay | Admitting: Medical-Surgical

## 2023-04-17 ENCOUNTER — Telehealth: Payer: Self-pay

## 2023-04-17 NOTE — Telephone Encounter (Signed)
Initiated Prior authorization ZOX:WRUEAVW (2 MG/DOSE) 8MG /3ML pen-injectors Via: Covermymeds Case/Key:[id number] Status: denied as of 04/07/23 Reason:Ozempic Inj 8mg /61ml is not covered by your plan benefits. Your Medicare Advantage (MA) plan's drug coverage is limited to only the drugs covered under your Part B medical benefit. This plan does not include Part D prescription drug coverage. Please refer to your Evidence of Coverage (EOC) for more information. Notified Pt via: Mychart

## 2023-04-17 NOTE — Telephone Encounter (Signed)
Patient came into office requesting any communication from [employer], and [employer]  dated from 01/2021-today concerning her Administrator, sports, including any medical documentation. Patient states that she has been out of medication for 3 weeks, and patient will pick up medication from office, if can not provide with 24 hours, patient would like to receive a call, please advise, thanks.

## 2023-04-28 ENCOUNTER — Encounter: Payer: Self-pay | Admitting: Medical-Surgical

## 2023-04-28 ENCOUNTER — Ambulatory Visit: Payer: Medicare Other | Admitting: Medical-Surgical

## 2023-04-28 DIAGNOSIS — Z6838 Body mass index (BMI) 38.0-38.9, adult: Secondary | ICD-10-CM

## 2023-04-28 DIAGNOSIS — R0683 Snoring: Secondary | ICD-10-CM | POA: Insufficient documentation

## 2023-04-28 DIAGNOSIS — I10 Essential (primary) hypertension: Secondary | ICD-10-CM | POA: Diagnosis not present

## 2023-04-28 NOTE — Progress Notes (Signed)
        Established patient visit  History, exam, impression, and plan:  1. Class 2 severe obesity due to excess calories with serious comorbidity and body mass index (BMI) of 38.0 to 38.9 in adult [facility]) Pleasant 67 year old female presenting today to discuss continued concerns with obesity.  Over the last couple of years, she has been using Ozempic, tolerating well without significant side effects.  Reports that she feels that she had reached a plateau with the Ozempic and although she had some initial benefit, over the last several months, has not had any further weight loss.  Unfortunately, when presented to insurance, this was denied for authorization as she is not type II diabetic.  Today she is interested in discussing options and request for further documentation to facilitate authorization of continued weight loss medication.  Her specific request at this time is for regarding her history, health concerns, and benefits that she has and/or will experience.  We did discuss medications that are currently being used for weight loss.  With recent insurance changes, we did review that there is no guarantee that an injectable medication such as Ozempic/Wegovy or Mounjaro/Zepbound will be approved through her insurance carrier but I am certainly willing to try.  After discussion, I do not feel that Ozempic/Wegovy will provide much more benefit given the last several months and plateaued weight.  She would like to try Mounjaro or Zepbound.  Plan made to draft a letter for her to provide to her insurance company regarding the medication to see if we can get this covered.  At that point, I will be glad to send in the starter dose of Mounjaro or Zepbound depending on insurance preference.  If we are unable to get this approved through her insurance, she is aware that there are options online and in the nearby community that do provide weight loss services/medications.  As with any recommendations for weight  management, I do recommend regular intentional exercise as well as working on dietary modifications. - Home sleep test; Future  2. Essential hypertension She does have a history of essential hypertension that is currently managed with losartan 50 mg daily and Toprol-XL 25 mg daily.  Blood pressure today is at goal.  3. Loud snoring She does have some loud snoring.  This has improved after losing a bit of weight however has not resolved.  No recent sleep study on file.  Would like to evaluate for potential sleep apnea.  Patient is agreeable so sleep study ordered. - Home sleep test; Future   Procedures performed this visit: None.  Return if symptoms worsen or fail to improve.  Further follow-up pending insurance coverage for medications.  __________________________________ Thayer Ohm, DNP, APRN, FNP-BC Primary Care and Sports Medicine [facility]

## 2023-04-29 ENCOUNTER — Encounter: Payer: Self-pay | Admitting: Medical-Surgical

## 2023-04-30 ENCOUNTER — Ambulatory Visit (INDEPENDENT_AMBULATORY_CARE_PROVIDER_SITE_OTHER): Payer: Medicare Other

## 2023-04-30 DIAGNOSIS — Z1231 Encounter for screening mammogram for malignant neoplasm of breast: Secondary | ICD-10-CM

## 2023-05-05 ENCOUNTER — Encounter: Payer: Self-pay | Admitting: Medical-Surgical

## 2023-05-08 ENCOUNTER — Encounter: Payer: Self-pay | Admitting: Medical-Surgical

## 2023-05-08 DIAGNOSIS — M48061 Spinal stenosis, lumbar region without neurogenic claudication: Secondary | ICD-10-CM

## 2023-05-08 DIAGNOSIS — E7849 Other hyperlipidemia: Secondary | ICD-10-CM

## 2023-05-08 DIAGNOSIS — R0683 Snoring: Secondary | ICD-10-CM

## 2023-05-08 DIAGNOSIS — I10 Essential (primary) hypertension: Secondary | ICD-10-CM

## 2023-05-08 MED ORDER — MOUNJARO 2.5 MG/0.5ML ~~LOC~~ SOAJ: 2.5000 mg | SUBCUTANEOUS | 0 refills | Status: DC

## 2023-05-12 ENCOUNTER — Telehealth: Payer: Self-pay

## 2023-05-12 ENCOUNTER — Telehealth: Payer: Self-pay | Admitting: Medical-Surgical

## 2023-05-12 NOTE — Telephone Encounter (Addendum)
Initiated Prior authorization CHE:NIDPOEUM 2.5MG /0.5ML pen-injectors Via: Covermymeds Case/Key:[id number] Status: denied as of 05/12/23 Reason:Why was my request denied? This request was denied because you did not meet the following requirements: Based on the information provided, you do not meet the established medication-specific criteria or guidelines for [facility] at this time. The request for coverage for [facility] INJ 2.5/0.5, use as directed (2 per month), is denied. This decision is based on health plan criteria for [facility] INJ 2.5/0.5. This medicine is covered only if: One of the following: (1) Your doctor submits medical records (for example, chart notes) confirming diagnosis of type 2 diabetes mellitus as evidenced by one of the following laboratory values: (A) A1C greater than or equal to 6.5%. [phone] All [employer] trademarks and logos are owned by [employer]. All other brand or product names are trademarks or registered marks of their respective owners. All rights reserved. Page 2 of 8 (B) Fasting plasma glucose greater than or equal to 126mg /dL. (C) 2-hour plasma glucose greater than or equal to 200mg /dL during oral glucose tolerance test. (D) Random plasma glucose greater than or equal to 200mg /dL with classic symptoms of hyperglycemia or hyperglycemic crisis. (2) If you require ongoing treatment for type 2 diabetes mellitus (that is, diagnosed greater than 2 years ago), your doctor submits medical records (for example, chart notes) confirming diagnosis of type 2 diabetes mellitus. The information provided does not show that you meet the criteria listed above. Reviewed by: Elisabeth CaraPh.  The reason(s) [employer] did not approve this medication can be found above. This denial is based on our [facility] drug coverage policy, in addition to any supplementary information you or your prescriber may have submitted. Our utilization review process included a clinical peer in the same  or similar specialty that made the adverse determination. If you need assistance understanding this notice or our decision to deny you a service or coverage please contact us at [phone]. A copy of this request with additional chart notes and letter have been left at the front desk as per your request, Both management and your pcp have been informed of this communication. Notified Pt via: Mychart

## 2023-05-12 NOTE — Telephone Encounter (Signed)
Pt called. She said to thank you for your quick response earlier to her. Per her insurance, Greggory Keen was not denied it is pending because additional information is needed. Insurance has called and left message and also sent fax of information that is needed. She is requesting you to send her a message via mychart to let her know when it is taken care of. She is also requesting you to leave copies of what you sent her insurance at the front deskk.

## 2023-05-13 MED ORDER — ZEPBOUND 2.5 MG/0.5ML ~~LOC~~ SOAJ: 2.5000 mg | SUBCUTANEOUS | 0 refills | Status: DC

## 2023-05-13 NOTE — Telephone Encounter (Signed)
Made in error

## 2023-05-13 NOTE — Addendum Note (Signed)
Addended byChristen Butter on: 05/13/2023 08:30 AM   Modules accepted: Orders

## 2023-05-15 ENCOUNTER — Other Ambulatory Visit: Payer: Self-pay

## 2023-05-29 ENCOUNTER — Ambulatory Visit (HOSPITAL_BASED_OUTPATIENT_CLINIC_OR_DEPARTMENT_OTHER): Payer: Medicare Other | Attending: Medical-Surgical | Admitting: Internal Medicine

## 2023-05-29 DIAGNOSIS — G4733 Obstructive sleep apnea (adult) (pediatric): Secondary | ICD-10-CM | POA: Diagnosis not present

## 2023-05-29 DIAGNOSIS — R0683 Snoring: Secondary | ICD-10-CM | POA: Insufficient documentation

## 2023-05-29 DIAGNOSIS — Z6838 Body mass index (BMI) 38.0-38.9, adult: Secondary | ICD-10-CM | POA: Insufficient documentation

## 2023-06-03 ENCOUNTER — Other Ambulatory Visit: Payer: Self-pay | Admitting: Sports Medicine

## 2023-06-03 DIAGNOSIS — M170 Bilateral primary osteoarthritis of knee: Secondary | ICD-10-CM

## 2023-06-06 DIAGNOSIS — R0683 Snoring: Secondary | ICD-10-CM | POA: Diagnosis not present

## 2023-06-06 NOTE — Procedures (Signed)
   Patient Name: Pamela Newman, Pamela Newman Date: 06/01/2023 Gender: Female D.O.B: October 02, 1956 Age (years): 53 Referring Provider: Christen Butter NP Height (inches): 65 Interpreting Physician: Jetty Duhamel MD, ABSM Weight (lbs): 230 RPSGT: [place] BMI: 38 MRN: [id number] Neck Size: 15.50  CLINICAL INFORMATION Sleep Study Type: HST Indication for sleep study: Snoring Epworth Sleepiness Score: 7  SLEEP STUDY TECHNIQUE A multi-channel overnight portable sleep study was performed. The channels recorded were: nasal airflow, thoracic respiratory movement, and oxygen saturation with a pulse oximetry. Snoring was also monitored.  MEDICATIONS Patient self administered medications include: none reported.  SLEEP ARCHITECTURE Patient was studied for 402.4 minutes. The sleep efficiency was 100.0 % and the patient was supine for 0%. The arousal index was 0.0 per hour.  RESPIRATORY PARAMETERS The overall AHI was 11.8 per hour, with a central apnea index of 0 per hour. The oxygen nadir was 85% during sleep.  CARDIAC DATA Mean heart rate during sleep was 85.1 bpm.  IMPRESSIONS - Mild obstructive sleep apnea occurred during this study (AHI = 11.8/h). - Moderate oxygen desaturation was noted during this study (Min O2 = 85%, Mean 95%). - Patient snored.  DIAGNOSIS - Obstructive Sleep Apnea (G47.33)  RECOMMENDATIONS - Treatment for mild OSA is guided by symptoms and co-morbidity. Conservative measures may include observation, weight loss and sleep position off back. Other options, including CPAP, a fitted oral appliance, or ENT evaluation, would be based on clinical judgment. - Be careful with alcohol, sedatives and other CNS depressants that may worsen sleep apnea and disrupt normal sleep architecture. - Sleep hygiene should be reviewed to assess factors that may improve sleep quality. - Weight management and regular exercise should be initiated or continued.  [Electronically signed]  06/06/2023 12:38 PM  Jetty Duhamel MD, ABSM Diplomate, American Board of Sleep Medicine NPI: [id number]                          Jetty Duhamel Diplomate, American Board of Sleep Medicine  ELECTRONICALLY SIGNED ON:  06/06/2023, 12:34 PM [facility] PH: [phone]   FX: [phone] ACCREDITED BY THE AMERICAN ACADEMY OF SLEEP MEDICINE

## 2023-06-08 ENCOUNTER — Encounter: Payer: Self-pay | Admitting: Medical-Surgical

## 2023-06-08 DIAGNOSIS — G4733 Obstructive sleep apnea (adult) (pediatric): Secondary | ICD-10-CM

## 2023-06-19 NOTE — Telephone Encounter (Signed)
Joy, we can try Dr. Jerre Simon at [facility],  Pulmonary, or [facility].

## 2023-07-01 ENCOUNTER — Encounter: Payer: Self-pay | Admitting: Medical-Surgical

## 2023-07-01 DIAGNOSIS — E559 Vitamin D deficiency, unspecified: Secondary | ICD-10-CM

## 2023-07-01 DIAGNOSIS — E66812 Obesity, class 2: Secondary | ICD-10-CM

## 2023-07-01 DIAGNOSIS — I10 Essential (primary) hypertension: Secondary | ICD-10-CM

## 2023-07-01 DIAGNOSIS — E7849 Other hyperlipidemia: Secondary | ICD-10-CM

## 2023-07-03 NOTE — Addendum Note (Signed)
Addended byChristen Butter on: 07/03/2023 08:11 AM   Modules accepted: Orders

## 2023-07-04 LAB — CMP14+EGFR
ALT: 20 IU/L (ref 0–32)
AST: 23 IU/L (ref 0–40)
Albumin: 4.6 g/dL (ref 3.9–4.9)
Alkaline Phosphatase: 72 IU/L (ref 44–121)
BUN/Creatinine Ratio: 19 (ref 12–28)
BUN: 22 mg/dL (ref 8–27)
Bilirubin Total: 0.4 mg/dL (ref 0.0–1.2)
CO2: 20 mmol/L (ref 20–29)
Calcium: 10 mg/dL (ref 8.7–10.3)
Chloride: 104 mmol/L (ref 96–106)
Creatinine, Ser: 1.16 mg/dL — ABNORMAL HIGH (ref 0.57–1.00)
Globulin, Total: 2.1 g/dL (ref 1.5–4.5)
Glucose: 98 mg/dL (ref 70–99)
Potassium: 4.6 mmol/L (ref 3.5–5.2)
Sodium: 139 mmol/L (ref 134–144)
Total Protein: 6.7 g/dL (ref 6.0–8.5)
eGFR: 51 mL/min/{1.73_m2} — ABNORMAL LOW (ref >59)

## 2023-07-04 LAB — CBC WITH DIFFERENTIAL/PLATELET
Basophils Absolute: 0 10*3/uL (ref 0.0–0.2)
Basos: 1 %
EOS (ABSOLUTE): 0.1 10*3/uL (ref 0.0–0.4)
Eos: 2 %
Hematocrit: 42.9 % (ref 34.0–46.6)
Hemoglobin: 13.8 g/dL (ref 11.1–15.9)
Immature Grans (Abs): 0 10*3/uL (ref 0.0–0.1)
Immature Granulocytes: 0 %
Lymphocytes Absolute: 1.6 10*3/uL (ref 0.7–3.1)
Lymphs: 36 %
MCH: 30.3 pg (ref 26.6–33.0)
MCHC: 32.2 g/dL (ref 31.5–35.7)
MCV: 94 fL (ref 79–97)
Monocytes Absolute: 0.4 10*3/uL (ref 0.1–0.9)
Monocytes: 9 %
Neutrophils Absolute: 2.4 10*3/uL (ref 1.4–7.0)
Neutrophils: 52 %
Platelets: 196 10*3/uL (ref 150–450)
RBC: 4.55 x10E6/uL (ref 3.77–5.28)
RDW: 13.7 % (ref 11.7–15.4)
WBC: 4.5 10*3/uL (ref 3.4–10.8)

## 2023-07-04 LAB — TSH: TSH: 1.5 u[IU]/mL (ref 0.450–4.500)

## 2023-07-04 LAB — LIPID PANEL
Chol/HDL Ratio: 2.7 ratio (ref 0.0–4.4)
Cholesterol, Total: 107 mg/dL (ref 100–199)
HDL: 40 mg/dL (ref >39)
LDL Chol Calc (NIH): 48 mg/dL (ref 0–99)
Triglycerides: 103 mg/dL (ref 0–149)
VLDL Cholesterol Cal: 19 mg/dL (ref 5–40)

## 2023-07-04 LAB — VITAMIN D 25 HYDROXY (VIT D DEFICIENCY, FRACTURES): Vit D, 25-Hydroxy: 121 ng/mL — ABNORMAL HIGH (ref 30.0–100.0)

## 2023-07-04 LAB — VITAMIN B12: Vitamin B-12: >2000 pg/mL — ABNORMAL HIGH (ref 232–1245)

## 2023-07-06 ENCOUNTER — Other Ambulatory Visit: Payer: Self-pay | Admitting: Medical-Surgical

## 2023-07-06 DIAGNOSIS — I10 Essential (primary) hypertension: Secondary | ICD-10-CM

## 2023-07-07 ENCOUNTER — Ambulatory Visit: Payer: Medicare Other | Admitting: Medical-Surgical

## 2023-07-07 ENCOUNTER — Encounter: Payer: Self-pay | Admitting: Medical-Surgical

## 2023-07-07 VITALS — BP 109/72 | HR 80 | Ht 65.0 in | Wt 223.1 lb

## 2023-07-07 DIAGNOSIS — I10 Essential (primary) hypertension: Secondary | ICD-10-CM

## 2023-07-07 DIAGNOSIS — G4733 Obstructive sleep apnea (adult) (pediatric): Secondary | ICD-10-CM

## 2023-07-07 DIAGNOSIS — E7849 Other hyperlipidemia: Secondary | ICD-10-CM

## 2023-07-07 DIAGNOSIS — N289 Disorder of kidney and ureter, unspecified: Secondary | ICD-10-CM | POA: Diagnosis not present

## 2023-07-07 DIAGNOSIS — Z6837 Body mass index (BMI) 37.0-37.9, adult: Secondary | ICD-10-CM

## 2023-07-07 DIAGNOSIS — M170 Bilateral primary osteoarthritis of knee: Secondary | ICD-10-CM

## 2023-07-07 MED ORDER — LOSARTAN POTASSIUM 50 MG PO TABS
50.0000 mg | ORAL_TABLET | Freq: Every day | ORAL | 3 refills | Status: DC
Start: 2023-07-07 — End: 2023-12-01

## 2023-07-07 MED ORDER — ATORVASTATIN CALCIUM 20 MG PO TABS: 20.0000 mg | ORAL_TABLET | Freq: Every day | ORAL | 3 refills | Status: DC

## 2023-07-07 NOTE — Progress Notes (Signed)
        Established patient visit  History, exam, impression, and plan:  1. Essential hypertension Pleasant 67 year old female presenting today with a history of HTN currently being treated with Toprol XL 25mg  daily and Losartan 50mg  daily. Tolerating both medications well without side effects. Has been monitoring BP at home and reports that her readings are similar to today. As she is working on weight loss, wonders if she would be able to come off one or both of the medications. Her heart rate is well controled and she is no longer having issues with palpitations. Ok to trial coming of the Toprol XL with close monitoring of the BP and heart rate. If BP stays well controled on the Losartan, may consider dose reduction as she loses more weight.  - losartan (COZAAR) 50 MG tablet; Take 1 tablet (50 mg total) by mouth daily.  Dispense: 90 tablet; Refill: 3  2. Abnormal kidney function Recent labs showed reduced GFR and increase in creatinine. Has been using Celebrex as prescribed by Dr. Karie Schwalbe. Plan to reduce the use of Celebrex and substitute Tylenol whenever possible. Work on staying well hydrated. Recheck BMP in 2-4 weeks.  - Basic Metabolic Panel (BMET)  3. Other hyperlipidemia Recent lipids looked exceptional and well below goal. With changes in her diet and current efforts at weight loss, she is interested in reducing her Atorvastatin from 40mg  daily to 20mg  daily. Tolerates the medication well without side effects but is working toward being on as little medication as possible to control her health concerns. Changing Atorvastatin to 20mg  daily with plan to recheck lipids at our next visit.   4. Class 2 severe obesity due to excess calories with serious comorbidity and body mass index (BMI) of 37.0 to 37.9 in adult [facility]) Currently using Zepbound through an online supplier as insurance would not approve any of the weight loss medications despite multiple efforts. Doing well on the medication and has  seen better weight loss so far that she did with Ozempic. Exercising regularly and working on dietary changes to maximize her response to the Zepbound. Will continue to use the online supplier for now unless something changes with her insurance or the cost of the medication.   5. Primary osteoarthritis of both knees As noted above, using Celebrex regularly and finds the medication helpful. With reduced kidney function, will hold off on this and aim for Tylenol as a first line treatment with the knowledge that she can take the Celebrex if absolutely needed. Recheck kidney function as above.   6. OSA (obstructive sleep apnea) Recent sleep study showing mild sleep apnea. Has an appointment scheduled with Sleep Medicine to discuss options for treatment.    Procedures performed this visit: None.  Return in about 6 months (around 01/04/2024) for chronic disease follow up.  __________________________________ Thayer Ohm, DNP, APRN, FNP-BC Primary Care and Sports Medicine [facility] [place]

## 2023-07-08 DIAGNOSIS — G4733 Obstructive sleep apnea (adult) (pediatric): Secondary | ICD-10-CM | POA: Insufficient documentation

## 2023-07-10 ENCOUNTER — Encounter: Payer: Self-pay | Admitting: Medical-Surgical

## 2023-07-21 ENCOUNTER — Institutional Professional Consult (permissible substitution): Payer: Medicare Other | Admitting: Adult Health

## 2023-07-28 ENCOUNTER — Encounter: Payer: Self-pay | Admitting: Medical-Surgical

## 2023-07-28 DIAGNOSIS — N289 Disorder of kidney and ureter, unspecified: Secondary | ICD-10-CM

## 2023-07-30 ENCOUNTER — Ambulatory Visit: Payer: Medicare Other | Admitting: Medical-Surgical

## 2023-07-31 LAB — BASIC METABOLIC PANEL
BUN/Creatinine Ratio: 19 (ref 12–28)
BUN: 22 mg/dL (ref 8–27)
CO2: 21 mmol/L (ref 20–29)
Calcium: 9.6 mg/dL (ref 8.7–10.3)
Chloride: 102 mmol/L (ref 96–106)
Creatinine, Ser: 1.18 mg/dL — ABNORMAL HIGH (ref 0.57–1.00)
Glucose: 98 mg/dL (ref 70–99)
Potassium: 4.4 mmol/L (ref 3.5–5.2)
Sodium: 136 mmol/L (ref 134–144)
eGFR: 50 mL/min/{1.73_m2} — ABNORMAL LOW (ref >59)

## 2023-07-31 NOTE — Telephone Encounter (Signed)
I have updated this medication list in the patients chart as of today.

## 2023-07-31 NOTE — Addendum Note (Signed)
Addended by: Latanya Presser on: 07/31/2023 04:26 PM   Modules accepted: Orders

## 2023-08-03 ENCOUNTER — Ambulatory Visit: Payer: Medicare Other

## 2023-08-03 DIAGNOSIS — N289 Disorder of kidney and ureter, unspecified: Secondary | ICD-10-CM

## 2023-08-05 ENCOUNTER — Other Ambulatory Visit: Payer: Medicare Other

## 2023-08-07 LAB — PROTEIN ELECTROPHORESIS, URINE REFLEX
Albumin ELP, Urine: 100 %
Alpha-1-Globulin, U: 0 %
Alpha-2-Globulin, U: 0 %
Beta Globulin, U: 0 %
Gamma Globulin, U: 0 %
Protein, Ur: 5.9 mg/dL

## 2023-08-07 LAB — PROTEIN ELECTROPHORESIS, SERUM
A/G Ratio: 1.6 (ref 0.7–1.7)
Albumin ELP: 4 g/dL (ref 2.9–4.4)
Alpha 1: 0.2 g/dL (ref 0.0–0.4)
Alpha 2: 0.7 g/dL (ref 0.4–1.0)
Beta: 1 g/dL (ref 0.7–1.3)
Gamma Globulin: 0.6 g/dL (ref 0.4–1.8)
Globulin, Total: 2.5 g/dL (ref 2.2–3.9)
Total Protein: 6.5 g/dL (ref 6.0–8.5)

## 2023-08-07 LAB — C-REACTIVE PROTEIN: CRP: <1 mg/L (ref 0–10)

## 2023-08-07 LAB — HIV ANTIBODY (ROUTINE TESTING W REFLEX): HIV Screen 4th Generation wRfx: NONREACTIVE

## 2023-08-07 LAB — HEPATITIS C ANTIBODY: Hep C Virus Ab: NONREACTIVE

## 2023-08-07 LAB — MICROALBUMIN / CREATININE URINE RATIO
Creatinine, Urine: 63.4 mg/dL
Microalb/Creat Ratio: <5 mg/g{creat} (ref 0–29)
Microalbumin, Urine: <3 ug/mL

## 2023-08-07 LAB — SEDIMENTATION RATE: Sed Rate: 2 mm/h (ref 0–40)

## 2023-08-07 LAB — ANA: Anti Nuclear Antibody (ANA): NEGATIVE

## 2023-08-13 NOTE — Addendum Note (Signed)
Addended byChristen Butter on: 08/13/2023 10:27 AM   Modules accepted: Orders

## 2023-09-01 ENCOUNTER — Other Ambulatory Visit: Payer: Self-pay | Admitting: Medical-Surgical

## 2023-09-01 DIAGNOSIS — I10 Essential (primary) hypertension: Secondary | ICD-10-CM

## 2023-09-07 ENCOUNTER — Ambulatory Visit (INDEPENDENT_AMBULATORY_CARE_PROVIDER_SITE_OTHER): Payer: Medicare Other | Admitting: Medical-Surgical

## 2023-09-07 DIAGNOSIS — Z0000 Encounter for general adult medical examination without abnormal findings: Secondary | ICD-10-CM | POA: Diagnosis not present

## 2023-09-07 NOTE — Progress Notes (Signed)
MEDICARE ANNUAL WELLNESS VISIT  09/07/2023  Telephone Visit Disclaimer This Medicare AWV was conducted by telephone due to national recommendations for restrictions regarding the COVID-19 Pandemic (e.g. social distancing).  I verified, using two identifiers, that I am speaking with Pamela Newman or their authorized healthcare agent. I discussed the limitations, risks, security, and privacy concerns of performing an evaluation and management service by telephone and the potential availability of an in-person appointment in the future. The patient expressed understanding and agreed to proceed.  Location of Patient: Home Location of Provider (nurse):  In the office.  Subjective:    Pamela Newman is a 67 y.o. female patient of Pamela Butter, NP who had a Medicare Annual Wellness Visit today via telephone. Pamela Newman is Retired and lives with their spouse. she has 1 child. she reports that she is socially active and does interact with friends/family regularly. she is moderately physically active and enjoys doing crafts and refurnishing furniture.  Patient Care Team: Pamela Butter, NP as PCP - General (Nurse Practitioner)     09/07/2023    1:44 PM 08/06/2022    2:41 PM 04/22/2021   10:15 AM 11/21/2015    1:49 PM  Advanced Directives  Does Patient Have a Medical Advance Directive? Yes Yes Yes Yes  Type of Advance Directive Living will  Living will;Healthcare Power of [employer] Power of [place];Living will  Does patient want to make changes to medical advance directive? No - Patient declined  No - Patient declined No - Patient declined  Copy of Healthcare Power of Attorney in Chart?   No - copy requested Yes    Hospital Utilization Over the Past 12 Months: # of hospitalizations or ER visits: 0 # of surgeries: 0  Review of Systems    Patient reports that her overall health is better compared to last year.  History obtained from chart review and the patient  Patient Reported Readings (BP,  Pulse, CBG, Weight, etc) none Per patient no change in vitals since last visit, unable to obtain new vitals due to telehealth visit  Pain Assessment Pain : 0-10 Pain Score: 2  Pain Type: Chronic pain Pain Location: Back Pain Orientation: Lower Pain Descriptors / Indicators: Discomfort Pain Onset: More than a month ago Pain Frequency: Constant     Current Medications & Allergies (verified) Allergies as of 09/07/2023   No Known Allergies      Medication List        Accurate as of September 07, 2023  1:55 PM. If you have any questions, ask your nurse or doctor.          acetaminophen 650 MG CR tablet Commonly known as: TYLENOL Take 650 mg by mouth every 8 (eight) hours as needed.   atorvastatin 20 MG tablet Commonly known as: LIPITOR Take 1 tablet (20 mg total) by mouth daily.   losartan 50 MG tablet Commonly known as: COZAAR Take 1 tablet (50 mg total) by mouth daily.   metoprolol succinate 25 MG 24 hr tablet Commonly known as: TOPROL-XL TAKE 1 TABLET BY MOUTH EVERY DAY   PROBIOTIC ACIDOPHILUS PO Take by mouth.   tirzepatide 12.5 MG/0.5ML Pen Commonly known as: MOUNJARO        History (reviewed): Past Medical History:  Diagnosis Date   Allergy    Anxiety    Arthritis    Asthma    Cataract    Essential hypertension 11/21/2015   Heart disease    SMALL PROLAPSE   Hypertension  Memory loss    january 2022, 10 minutes memory loss, all tests normal and no incidence since   MVP (mitral valve prolapse)    Sleep apnea 06/2023   Past Surgical History:  Procedure Laterality Date   BREAST BIOPSY Left    benign    BREAST SURGERY  2001   BREAST CYST   CATARACT EXTRACTION Bilateral    CESAREAN SECTION     COLONOSCOPY     EYE SURGERY  2010   Cateracts   TONSILLECTOMY AND ADENOIDECTOMY     Family History  Problem Relation Age of Onset   Cancer Mother        BREAST   Hypertension Mother    Hyperlipidemia Mother    Hypertension Father     Hyperlipidemia Father    Stroke Father    Esophageal cancer Sister    Cancer Sister        LUNG   Colon cancer Maternal Grandfather    Cancer Maternal Grandfather        COLON   Social History   Socioeconomic History   Marital status: Married    Spouse name: Fredrik Cove   Number of children: 1   Years of education: 13   Highest education level: Some college, no degree  Occupational History    Comment: Retired  Tobacco Use   Smoking status: Never   Smokeless tobacco: Never  Vaping Use   Vaping status: Never Used  Substance and Sexual Activity   Alcohol use: Not Currently   Drug use: No   Sexual activity: Not on file  Other Topics Concern   Not on file  Social History Narrative   Likes with her husband. She enjoys doing crafts and refurnishing furniture.   Social Determinants of Health   Financial Resource Strain: Low Risk  (09/07/2023)   Overall Financial Resource Strain (CARDIA)    Difficulty of Paying Living Expenses: Not hard at all  Food Insecurity: No Food Insecurity (09/07/2023)   Hunger Vital Sign    Worried About Running Out of Food in the Last Year: Never true    Ran Out of Food in the Last Year: Never true  Transportation Needs: No Transportation Needs (09/07/2023)   PRAPARE - Administrator, Civil Service (Medical): No    Lack of Transportation (Non-Medical): No  Physical Activity: Insufficiently Active (09/07/2023)   Exercise Vital Sign    Days of Exercise per Week: 3 days    Minutes of Exercise per Session: 20 min  Stress: No Stress Concern Present (09/07/2023)   [employer] of Occupational Health - Occupational Stress Questionnaire    Feeling of Stress : Only a little  Social Connections: Moderately Isolated (09/07/2023)   Social Connection and Isolation Panel [NHANES]    Frequency of Communication with Friends and Family: Twice a week    Frequency of Social Gatherings with Friends and Family: Once a week    Attends Religious Services: Never     Database administrator or Organizations: No    Attends Banker Meetings: Not on file    Marital Status: Married    Activities of Daily Living    09/07/2023   12:40 AM  In your present state of health, do you have any difficulty performing the following activities:  Hearing? 0  Vision? 0  Difficulty concentrating or making decisions? 0  Walking or climbing stairs? 0  Dressing or bathing? 0  Doing errands, shopping? 0  Preparing Food and eating ?  N  Using the Toilet? N  In the past six months, have you accidently leaked urine? N  Do you have problems with loss of bowel control? N  Managing your Medications? N  Managing your Finances? N  Housekeeping or managing your Housekeeping? N    Patient Education/ Literacy How often do you need to have someone help you when you read instructions, pamphlets, or other written materials from your doctor or pharmacy?: 1 - Never What is the last grade level you completed in school?: Some college  Exercise    Diet Patient reports consuming  2-3  meals a day and 0-1 snack(s) a day Patient reports that her primary diet is: Regular Patient reports that she does have regular access to food.   Depression Screen    09/07/2023    1:40 PM 12/15/2022    3:28 PM 07/29/2022    4:57 PM 03/04/2022    2:50 PM 07/26/2021   10:39 AM 04/22/2021   10:15 AM 03/01/2019    3:49 PM  PHQ 2/9 Scores  PHQ - 2 Score 0 1 2 3  0 0 1  PHQ- 9 Score   6 7 0       Fall Risk    09/07/2023    1:39 PM 09/07/2023   12:40 AM 07/07/2023    3:20 PM 12/15/2022    3:28 PM 03/04/2022    2:49 PM  Fall Risk   Falls in the past year? 1 1 0 0 0  Number falls in past yr: 0 0 0 0 0  Injury with Fall? 0 0 0 0 0  Risk for fall due to : History of fall(s)  No Fall Risks No Fall Risks No Fall Risks  Follow up Falls evaluation completed;Education provided;Falls prevention discussed  Falls evaluation completed Falls evaluation completed Falls evaluation completed      Objective:  Pamela Newman seemed alert and oriented and she participated appropriately during our telephone visit.  Blood Pressure Weight BMI  BP Readings from Last 3 Encounters:  07/07/23 109/72  04/28/23 110/73  12/15/22 110/70   Wt Readings from Last 3 Encounters:  07/07/23 223 lb 1.9 oz (101.2 kg)  04/28/23 234 lb 1.6 oz (106.2 kg)  12/15/22 234 lb (106.1 kg)   BMI Readings from Last 1 Encounters:  07/07/23 37.13 kg/m    *Unable to obtain current vital signs, weight, and BMI due to telephone visit type  Hearing/Vision  Pamela Newman did not seem to have difficulty with hearing/understanding during the telephone conversation Reports that she has not had a formal eye exam by an eye care professional within the past year Reports that she has not had a formal hearing evaluation within the past year *Unable to fully assess hearing and vision during telephone visit type  Cognitive Function:    09/07/2023    1:44 PM 04/22/2021   10:20 AM  6CIT Screen  What Year? 0 points 0 points  What month? 0 points 0 points  What time? 0 points 0 points  Count back from 20 0 points 0 points  Months in reverse 0 points 0 points  Repeat phrase 0 points 0 points  Total Score 0 points 0 points   (Normal:0-7, Significant for Dysfunction: >8)  Normal Cognitive Function Screening: Yes   Immunization & Health Maintenance Record Immunization History  Administered Date(s) Administered   Influenza, High Dose Seasonal PF 01/19/2019, 11/11/2019   Influenza,inj,Quad PF,6+ Mos 07/09/2017   Influenza-Unspecified 11/01/2007, 10/11/2015, 01/19/2019, 11/11/2019   Td 11/03/1998  Tdap 11/18/2012   Zoster Recombinant(Shingrix) 07/09/2017   Zoster, Live 05/23/2014    Health Maintenance  Topic Date Due   Zoster Vaccines- Shingrix (2 of 2) 12/08/2023 (Originally 09/03/2017)   INFLUENZA VACCINE  02/01/2024 (Originally 06/04/2023)   DTaP/Tdap/Td (3 - Td or Tdap) 09/06/2024 (Originally 11/18/2022)   Pneumonia  Vaccine 40+ Years old (1 of 1 - PCV) 09/06/2024 (Originally 12/22/2018)   Medicare Annual Wellness (AWV)  09/06/2024   MAMMOGRAM  04/29/2025   DEXA SCAN  10/23/2026   Hepatitis C Screening  Completed   HPV VACCINES  Aged Out   Colonoscopy  Discontinued   COVID-19 Vaccine  Discontinued       Assessment  This is a routine wellness examination for Pamela Newman.  Health Maintenance: Due or Overdue There are no preventive care reminders to display for this patient.   Pamela Newman does not need a referral for Community Assistance: Care Management:   no Social Work:    no Prescription Assistance:  no Nutrition/Diabetes Education:  no   Plan:  Personalized Goals  Goals Addressed               This Visit's Progress     Patient Stated (pt-stated)        Patient stated that she would like to continue with her weight loss journey.        Personalized Health Maintenance & Screening Recommendations  2nd dose shingles vaccine  - declined  TDap Influenza- declined Pneumonia vaccine- declined  Lung Cancer Screening Recommended: no (Low Dose CT Chest recommended if Age 47-80 years, 20 pack-year currently smoking OR have quit w/in past 15 years) Hepatitis C Screening recommended: no HIV Screening recommended: no  Advanced Directives: Written information was not prepared per patient's request.  Referrals & Orders No orders of the defined types were placed in this encounter.   Follow-up Plan Follow-up with Pamela Butter, NP as planned Schedule tdap at the pharmacy. Medicare wellness visit in one year.  Patient will access AVS on my chart.   I have personally reviewed and noted the following in the patient's chart:   Medical and social history Use of alcohol, tobacco or illicit drugs  Current medications and supplements Functional ability and status Nutritional status Physical activity Advanced directives List of other physicians Hospitalizations, surgeries, and ER  visits in previous 12 months Vitals Screenings to include cognitive, depression, and falls Referrals and appointments  In addition, I have reviewed and discussed with Pamela Newman certain preventive protocols, quality metrics, and best practice recommendations. A written personalized care plan for preventive services as well as general preventive health recommendations is available and can be mailed to the patient at her request.      Modesto Charon, RN BSN  09/07/2023

## 2023-09-07 NOTE — Patient Instructions (Addendum)
MEDICARE ANNUAL WELLNESS VISIT Health Maintenance Summary and Written Plan of Care  Pamela Newman ,  Thank you for allowing me to perform your Medicare Annual Wellness Visit and for your ongoing commitment to your health.   Health Maintenance & Immunization History Health Maintenance  Topic Date Due   Zoster Vaccines- Shingrix (2 of 2) 12/08/2023 (Originally 09/03/2017)   INFLUENZA VACCINE  02/01/2024 (Originally 06/04/2023)   DTaP/Tdap/Td (3 - Td or Tdap) 09/06/2024 (Originally 11/18/2022)   Pneumonia Vaccine 69+ Years old (1 of 1 - PCV) 09/06/2024 (Originally 12/22/2018)   Medicare Annual Wellness (AWV)  09/06/2024   MAMMOGRAM  04/29/2025   DEXA SCAN  10/23/2026   Hepatitis C Screening  Completed   HPV VACCINES  Aged Out   Colonoscopy  Discontinued   COVID-19 Vaccine  Discontinued   Immunization History  Administered Date(s) Administered   Influenza, High Dose Seasonal PF 01/19/2019, 11/11/2019   Influenza,inj,Quad PF,6+ Mos 07/09/2017   Influenza-Unspecified 11/01/2007, 10/11/2015, 01/19/2019, 11/11/2019   Td 11/03/1998   Tdap 11/18/2012   Zoster Recombinant(Shingrix) 07/09/2017   Zoster, Live 05/23/2014    These are the patient goals that we discussed:  Goals Addressed               This Visit's Progress     Patient Stated (pt-stated)        Patient stated that she would like to continue with her weight loss journey.          This is a list of Health Maintenance Items that are overdue or due now: 2nd dose shingles vaccine - declined  TDap Influenza- declined Pneumonia vaccine- declined  Orders/Referrals Placed Today: No orders of the defined types were placed in this encounter.  (Contact our referral department at [phone] if you have not spoken with someone about your referral appointment within the next 5 days)    Follow-up Plan Follow-up with Christen Butter, NP as planned Schedule tdap at the pharmacy. Medicare wellness visit in one year.  Patient  will access AVS on my chart.      Health Maintenance, Female Adopting a healthy lifestyle and getting preventive care are important in promoting health and wellness. Ask your health care provider about: The right schedule for you to have regular tests and exams. Things you can do on your own to prevent diseases and keep yourself healthy. What should I know about diet, weight, and exercise? Eat a healthy diet  Eat a diet that includes plenty of vegetables, fruits, low-fat dairy products, and lean protein. Do not eat a lot of foods that are high in solid fats, added sugars, or sodium. Maintain a healthy weight Body mass index (BMI) is used to identify weight problems. It estimates body fat based on height and weight. Your health care provider can help determine your BMI and help you achieve or maintain a healthy weight. Get regular exercise Get regular exercise. This is one of the most important things you can do for your health. Most adults should: Exercise for at least 150 minutes each week. The exercise should increase your heart rate and make you sweat (moderate-intensity exercise). Do strengthening exercises at least twice a week. This is in addition to the moderate-intensity exercise. Spend less time sitting. Even light physical activity can be beneficial. Watch cholesterol and blood lipids Have your blood tested for lipids and cholesterol at 69 years of age, then have this test every 5 years. Have your cholesterol levels checked more often if: Your lipid or cholesterol  levels are high. You are older than 69 years of age. You are at high risk for heart disease. What should I know about cancer screening? Depending on your health history and family history, you may need to have cancer screening at various ages. This may include screening for: Breast cancer. Cervical cancer. Colorectal cancer. Skin cancer. Lung cancer. What should I know about heart disease, diabetes, and high  blood pressure? Blood pressure and heart disease High blood pressure causes heart disease and increases the risk of stroke. This is more likely to develop in people who have high blood pressure readings or are overweight. Have your blood pressure checked: Every 3-5 years if you are 69-69 years of age. Every year if you are 69 years old or older. Diabetes Have regular diabetes screenings. This checks your fasting blood sugar level. Have the screening done: Once every three years after age 92 if you are at a normal weight and have a low risk for diabetes. More often and at a younger age if you are overweight or have a high risk for diabetes. What should I know about preventing infection? Hepatitis B If you have a higher risk for hepatitis B, you should be screened for this virus. Talk with your health care provider to find out if you are at risk for hepatitis B infection. Hepatitis C Testing is recommended for: Everyone born from [birth year] through 1967. Anyone with known risk factors for hepatitis C. Sexually transmitted infections (STIs) Get screened for STIs, including gonorrhea and chlamydia, if: You are sexually active and are younger than 69 years of age. You are older than 69 years of age and your health care provider tells you that you are at risk for this type of infection. Your sexual activity has changed since you were last screened, and you are at increased risk for chlamydia or gonorrhea. Ask your health care provider if you are at risk. Ask your health care provider about whether you are at high risk for HIV. Your health care provider may recommend a prescription medicine to help prevent HIV infection. If you choose to take medicine to prevent HIV, you should first get tested for HIV. You should then be tested every 3 months for as long as you are taking the medicine. Pregnancy If you are about to stop having your period (premenopausal) and you may become pregnant, seek counseling  before you get pregnant. Take 400 to 800 micrograms (mcg) of folic acid every day if you become pregnant. Ask for birth control (contraception) if you want to prevent pregnancy. Osteoporosis and menopause Osteoporosis is a disease in which the bones lose minerals and strength with aging. This can result in bone fractures. If you are 76 years old or older, or if you are at risk for osteoporosis and fractures, ask your health care provider if you should: Be screened for bone loss. Take a calcium or vitamin D supplement to lower your risk of fractures. Be given hormone replacement therapy (HRT) to treat symptoms of menopause. Follow these instructions at home: Alcohol use Do not drink alcohol if: Your health care provider tells you not to drink. You are pregnant, may be pregnant, or are planning to become pregnant. If you drink alcohol: Limit how much you have to: 0-1 drink a day. Know how much alcohol is in your drink. In the [place] one drink equals one 12 oz bottle of beer (355 mL), one 5 oz glass of wine (148 mL), or one 1 oz glass of  hard liquor (44 mL). Lifestyle Do not use any products that contain nicotine or tobacco. These products include cigarettes, chewing tobacco, and vaping devices, such as e-cigarettes. If you need help quitting, ask your health care provider. Do not use street drugs. Do not share needles. Ask your health care provider for help if you need support or information about quitting drugs. General instructions Schedule regular health, dental, and eye exams. Stay current with your vaccines. Tell your health care provider if: You often feel depressed. You have ever been abused or do not feel safe at home. Summary Adopting a healthy lifestyle and getting preventive care are important in promoting health and wellness. Follow your health care provider's instructions about healthy diet, exercising, and getting tested or screened for diseases. Follow your health care  provider's instructions on monitoring your cholesterol and blood pressure. This information is not intended to replace advice given to you by your health care provider. Make sure you discuss any questions you have with your health care provider. Document Revised: 03/11/2021 Document Reviewed: 03/11/2021 Elsevier Patient Education  2024 [employer].

## 2023-10-22 ENCOUNTER — Ambulatory Visit: Payer: Medicare Other | Admitting: Sports Medicine

## 2023-10-29 ENCOUNTER — Ambulatory Visit: Payer: Medicare Other | Admitting: Medical-Surgical

## 2023-11-02 ENCOUNTER — Other Ambulatory Visit (INDEPENDENT_AMBULATORY_CARE_PROVIDER_SITE_OTHER): Payer: Medicare Other

## 2023-11-02 ENCOUNTER — Ambulatory Visit: Payer: Medicare Other | Admitting: Sports Medicine

## 2023-11-02 DIAGNOSIS — M48061 Spinal stenosis, lumbar region without neurogenic claudication: Secondary | ICD-10-CM

## 2023-11-02 DIAGNOSIS — M170 Bilateral primary osteoarthritis of knee: Secondary | ICD-10-CM | POA: Diagnosis not present

## 2023-11-02 NOTE — Assessment & Plan Note (Addendum)
Exquisitely pleasant 66 year old female with x-ray confirmed osteoarthritis, Celebrex helped significantly however she did have some renal insufficiency cyst Elvarex had to be stopped. We did injections earlier this year and she did well for 9-10 months. She also lost 50 pounds with tirzepatide. Doing extremely well but having recurrence of pain, repeat injections today. [place] would like to do some aquatic therapy, I will set this up at [facility]. Return as needed.

## 2023-11-02 NOTE — Assessment & Plan Note (Signed)
Also with multilevel lumbar DDD, central canal stenosis L4-L5 with axial pain better with flexion and radicular symptoms down to the left small toe consistent with a left S1 radiculitis. We will treat her knees aggressively first and if her back pain and leg pain improves we can just watch it, but if her back and leg pain continues we can proceed with an epidural, likely left selective S1 versus L4-L5 interlaminar.

## 2023-11-02 NOTE — Addendum Note (Signed)
Addended by: Monica Becton on: 11/02/2023 04:11 PM   Modules accepted: Orders

## 2023-11-02 NOTE — Progress Notes (Addendum)
    Procedures performed today:    Procedure: Real-time Ultrasound Guided injection of the left knee Device: Samsung HS60  Verbal informed consent obtained.  Time-out conducted.  Noted no overlying erythema, induration, or other signs of local infection.  Skin prepped in a sterile fashion.  Local anesthesia: Topical Ethyl chloride.  With sterile technique and under real time ultrasound guidance: trace effusion seen, 1 cc Kenalog 40, 2 cc lidocaine, 2 cc bupivacaine injected easily Completed without difficulty  Advised to call if fevers/chills, erythema, induration, drainage, or persistent bleeding.  Images permanently stored and available for review in PACS.  Impression: Technically successful ultrasound guided injection.  Procedure: Real-time Ultrasound Guided injection of the right knee Device: Samsung HS60  Verbal informed consent obtained.  Time-out conducted.  Noted no overlying erythema, induration, or other signs of local infection.  Skin prepped in a sterile fashion.  Local anesthesia: Topical Ethyl chloride.  With sterile technique and under real time ultrasound guidance: trace effusion seen, 1 cc Kenalog 40, 2 cc lidocaine, 2 cc bupivacaine injected easily Completed without difficulty  Advised to call if fevers/chills, erythema, induration, drainage, or persistent bleeding.  Images permanently stored and available for review in PACS.  Impression: Technically successful ultrasound guided injection.  Independent interpretation of notes and tests performed by another provider:   None.  Brief History, Exam, Impression, and Recommendations:    Primary osteoarthritis of both knees Exquisitely pleasant 67 year old female with x-ray confirmed osteoarthritis, Celebrex helped significantly however she did have some renal insufficiency cyst Elvarex had to be stopped. We did injections earlier this year and she did well for 9-10 months. She also lost 50 pounds with  tirzepatide. Doing extremely well but having recurrence of pain, repeat injections today. Lyncoln would like to do some aquatic therapy, I will set this up at [facility]. Return as needed.  Degenerative lumbar spinal stenosis Also with multilevel lumbar DDD, central canal stenosis L4-L5 with axial pain better with flexion and radicular symptoms down to the left small toe consistent with a left S1 radiculitis. We will treat her knees aggressively first and if her back pain and leg pain improves we can just watch it, but if her back and leg pain continues we can proceed with an epidural, likely left selective S1 versus L4-L5 interlaminar.    ____________________________________________ Ihor Austin. Benjamin Stain, M.D., ABFM., CAQSM., AME. Primary Care and Sports Medicine [facility]  Adjunct Professor of Family Medicine  [employer]  Restaurant manager, fast food

## 2023-11-03 DIAGNOSIS — M170 Bilateral primary osteoarthritis of knee: Secondary | ICD-10-CM

## 2023-11-03 MED ORDER — TRIAMCINOLONE ACETONIDE 40 MG/ML IJ SUSP
80.0000 mg | Freq: Once | INTRAMUSCULAR | Status: AC
Start: 2023-11-03 — End: 2023-11-03
  Administered 2023-11-03: 80 mg via INTRAMUSCULAR

## 2023-11-03 NOTE — Code Documentation (Signed)
done

## 2023-11-05 ENCOUNTER — Other Ambulatory Visit: Payer: Self-pay

## 2023-11-05 DIAGNOSIS — N289 Disorder of kidney and ureter, unspecified: Secondary | ICD-10-CM

## 2023-11-05 NOTE — Progress Notes (Signed)
Switched orders to [employer].

## 2023-11-06 ENCOUNTER — Encounter: Payer: Self-pay | Admitting: Medical-Surgical

## 2023-11-06 LAB — BMP8+EGFR
BUN/Creatinine Ratio: 23 (ref 12–28)
BUN: 27 mg/dL (ref 8–27)
CO2: 21 mmol/L (ref 20–29)
Calcium: 9.1 mg/dL (ref 8.7–10.3)
Chloride: 98 mmol/L (ref 96–106)
Creatinine, Ser: 1.2 mg/dL — ABNORMAL HIGH (ref 0.57–1.00)
Glucose: 89 mg/dL (ref 70–99)
Potassium: 4.2 mmol/L (ref 3.5–5.2)
Sodium: 136 mmol/L (ref 134–144)
eGFR: 49 mL/min/{1.73_m2} — ABNORMAL LOW (ref >59)

## 2023-11-09 ENCOUNTER — Encounter: Payer: Self-pay | Admitting: Medical-Surgical

## 2023-11-09 ENCOUNTER — Ambulatory Visit: Payer: Medicare Other | Admitting: Medical-Surgical

## 2023-11-09 VITALS — BP 110/69 | HR 79 | Resp 20 | Ht 65.0 in | Wt 202.1 lb

## 2023-11-09 DIAGNOSIS — N289 Disorder of kidney and ureter, unspecified: Secondary | ICD-10-CM | POA: Insufficient documentation

## 2023-11-09 DIAGNOSIS — G4733 Obstructive sleep apnea (adult) (pediatric): Secondary | ICD-10-CM | POA: Diagnosis not present

## 2023-11-09 DIAGNOSIS — I10 Essential (primary) hypertension: Secondary | ICD-10-CM

## 2023-11-09 DIAGNOSIS — Z7689 Persons encountering health services in other specified circumstances: Secondary | ICD-10-CM | POA: Diagnosis not present

## 2023-11-09 NOTE — Progress Notes (Signed)
        Established patient visit  History, exam, impression, and plan:  1. Essential hypertension (Primary) Pamela Newman 68 year old female accompanied by her husband presenting today for discussion of recent labs and recommendations.  She has a history of hypertension that is currently being managed with Toprol  XL 25 mg and losartan  50 mg daily.  Feels that her blood pressure runs on the lower side and wonders if she might be able to make adjustments in her medication.  Would like to try cutting losartan  in half to equal 25 mg daily.  Previously tried to discontinue it completely however her blood pressure quickly rose.  As her blood pressure is on the lower side of normal, feel that she is safe to trial losartan  at 25 mg daily.  If her blood pressure starts to creep up, she will need to go back up to the 50 mg dose.  If blood pressures are stable on 25 mg, she can let me know and I will send in a new supply of the 25 mg tablets for ease of dosing.  Continue Toprol -XL.  2. OSA (obstructive sleep apnea) Was diagnosed with sleep apnea however she was not interested in starting a CPAP.  She was working on getting a mouthguard however the cost was prohibitive.  She has been working to lose weight using tirzepatide  and her husband notes that her breathing has greatly improved at night while she is asleep.  Continue to monitor for now.  3. Abnormal kidney function Unfortunately, her kidney function has remained abnormal with most recent GFR of 49.  This is a new issue for her and we are not sure of the cause.  She has stopped taking anti-inflammatories and is drinking 70 to 75 ounces of fluid per day.  Her electrolytes and fluid balance are normal however we have an unclear picture of the cause of sudden renal function changes.  Plan to refer to nephrology for further investigation.  Patient is agreeable to the plan.  All questions answered at the time of the appointment. - Ambulatory referral to  Nephrology  4. Encounter for weight management She continues using compounded tirzepatide  and is on 25 units daily.  Unable to find accurate calculations for this however she reports that she is doing very well on the medication and continues to lose weight.  At some point, we may be able to get this approved through her insurance if it is not a coverage exclusion now that she has a diagnosis of sleep apnea and abnormal kidney function.  Plan to hold off on trying to get this pushed through so that she can see both cardiology and nephrology to get further recommendations.  Procedures performed this visit: None.  Return if symptoms worsen or fail to improve.  __________________________________ Zada FREDRIK Palin, DNP, APRN, FNP-BC Primary Care and Sports Medicine [facility]

## 2023-11-18 ENCOUNTER — Encounter: Payer: Self-pay | Admitting: Medical-Surgical

## 2023-11-25 ENCOUNTER — Encounter: Payer: Self-pay | Admitting: Medical-Surgical

## 2023-11-30 ENCOUNTER — Ambulatory Visit: Payer: Medicare Other | Admitting: Sports Medicine

## 2023-12-01 ENCOUNTER — Telehealth: Payer: Self-pay | Admitting: *Deleted

## 2023-12-01 ENCOUNTER — Encounter: Payer: Self-pay | Admitting: Cardiology

## 2023-12-01 ENCOUNTER — Ambulatory Visit: Payer: Medicare Other | Attending: Cardiology | Admitting: Cardiology

## 2023-12-01 VITALS — BP 90/72 | HR 77 | Ht 65.0 in | Wt 205.4 lb

## 2023-12-01 DIAGNOSIS — I10 Essential (primary) hypertension: Secondary | ICD-10-CM

## 2023-12-01 DIAGNOSIS — E7800 Pure hypercholesterolemia, unspecified: Secondary | ICD-10-CM

## 2023-12-01 DIAGNOSIS — G459 Transient cerebral ischemic attack, unspecified: Secondary | ICD-10-CM | POA: Diagnosis not present

## 2023-12-01 DIAGNOSIS — G4733 Obstructive sleep apnea (adult) (pediatric): Secondary | ICD-10-CM

## 2023-12-01 DIAGNOSIS — Z79899 Other long term (current) drug therapy: Secondary | ICD-10-CM | POA: Diagnosis not present

## 2023-12-01 MED ORDER — LOSARTAN POTASSIUM 25 MG PO TABS: 25.0000 mg | ORAL_TABLET | Freq: Every day | ORAL | 3 refills | Status: DC

## 2023-12-01 MED ORDER — ATORVASTATIN CALCIUM 10 MG PO TABS: 10.0000 mg | ORAL_TABLET | Freq: Every day | ORAL | 3 refills | Status: DC

## 2023-12-01 NOTE — Addendum Note (Signed)
Addended by: Luellen Pucker on: 12/01/2023 02:51 PM   Modules accepted: Orders

## 2023-12-01 NOTE — Patient Instructions (Signed)
Medication Instructions:  Please DECREASE your dose of atorvastatin to 10 mg daily.  Please DECREASE your dose of losartan to 25 mg daily.  *If you need a refill on your cardiac medications before your next appointment, please call your pharmacy*   Lab Work: Please complete a NONFASTING BMET in 1 week.  Please complete a FASTING lipid panel and an ALT in 8 weeks.  If you have labs (blood work) drawn today and your tests are completely normal, you will receive your results only by: MyChart Message (if you have MyChart) OR A paper copy in the mail If you have any lab test that is abnormal or we need to change your treatment, we will call you to review the results.   Testing/Procedures: Your physician has recommended that you have a home sleep study. This test records several body functions during sleep, including: brain activity, eye movement, oxygen and carbon dioxide blood levels, heart rate and rhythm, breathing rate and rhythm, the flow of air through your mouth and nose, snoring, body muscle movements, and chest and belly movement.    Follow-Up: At [facility], you and your health needs are our priority.  As part of our continuing mission to provide you with exceptional heart care, we have created designated Provider Care Teams.  These Care Teams include your primary Cardiologist (physician) and Advanced Practice Providers (APPs -  Physician Assistants and Nurse Practitioners) who all work together to provide you with the care you need, when you need it.  We recommend signing up for the patient portal called "MyChart".  Sign up information is provided on this After Visit Summary.  MyChart is used to connect with patients for Virtual Visits (Telemedicine).  Patients are able to view lab/test results, encounter notes, upcoming appointments, etc.  Non-urgent messages can be sent to your provider as well.   To learn more about what you can do with MyChart, go to  [email or web address].    Your next appointment:   1 year(s)  Provider:   Dr. Armanda Magic, MD

## 2023-12-01 NOTE — Progress Notes (Signed)
Cardiology Office Note:    Date:  12/01/2023   ID:  Pamela Newman, DOB 19-Oct-1956, MRN [id number]  PCP:  Christen Butter, NP   [facility] Providers Cardiologist:  None {  Referring MD: Christen Butter, NP     History of Present Illness:    Pamela Newman is a 68 y.o. female with a hx of HTN, HLD, depression, TIA and obesity on ozempic who was intially referred by Christen Butter, NP to Dr. Shari Prows for further evaluation of possible TIA.  Apparently in 11/2020 she had an episode where she had a lapse in memory transiently. She could not remember Christmas day. Saw her PCP where MRI showed chronic small vessel ischemic changes but no acute stroke. MRA neck without significant carotid disease. She was placed on medications to help control her blood pressure and has also worked on diet and weight loss and has successfully lost 25lbs. No recurrence of symptoms since that time. Given concern for stroke like symptoms, however, she was referred to Cardiology for further management.  Her cardiac workup included 2D echo 10/22/2021 showing EF 65 to 70%, G1 DD, normal LV strain, mild RVE with normal RV function.  Heart monitor 10/22/2021 showed 1 run of NSVT lasting 8 beats and 27 runs of SVT with the longest lasting 17 beats in a row with rare PVCs and PACs.  She is here today for followup and is doing well.  She denies any chest pain or pressure, SOB, DOE, PND, orthopnea, LE edema,palpitations or syncope.  She has had some issues with dizziness when she stands up too fast. She is compliant with her meds and is tolerating meds with no SE.    Past Medical History:  Diagnosis Date   Allergy    Anxiety    Arthritis    Asthma    Cataract    Essential hypertension 11/21/2015   Heart disease    SMALL PROLAPSE   Hypertension    Memory loss    january 2022, 10 minutes memory loss, all tests normal and no incidence since   MVP (mitral valve prolapse)    Sleep apnea 06/2023    Past Surgical History:   Procedure Laterality Date   BREAST BIOPSY Left    benign    BREAST SURGERY  2001   BREAST CYST   CATARACT EXTRACTION Bilateral    CESAREAN SECTION     COLONOSCOPY     EYE SURGERY  2010   Cateracts   TONSILLECTOMY AND ADENOIDECTOMY      Current Medications: Current Meds  Medication Sig   acetaminophen (TYLENOL) 650 MG CR tablet Take 650 mg by mouth every 8 (eight) hours as needed.   atorvastatin (LIPITOR) 20 MG tablet Take 1 tablet (20 mg total) by mouth daily.   Lactobacillus (PROBIOTIC ACIDOPHILUS PO) Take by mouth.   losartan (COZAAR) 50 MG tablet Take 1 tablet (50 mg total) by mouth daily.   metoprolol succinate (TOPROL-XL) 25 MG 24 hr tablet TAKE 1 TABLET BY MOUTH EVERY DAY   tirzepatide (MOUNJARO) 7.5 MG/0.5ML Pen Inject 7.5 mg into the skin once a week.   Vitamin D-Vitamin K (D3 + K2 PO) Take 1 capsule by mouth daily at 6 (six) AM.     Allergies:   Patient has no known allergies.   Social History   Socioeconomic History   Marital status: Married    Spouse name: Fredrik Cove   Number of children: 1   Years of education: 13   Highest education level: Some  college, no degree  Occupational History    Comment: Retired  Tobacco Use   Smoking status: Never   Smokeless tobacco: Never  Vaping Use   Vaping status: Never Used  Substance and Sexual Activity   Alcohol use: Not Currently   Drug use: No   Sexual activity: Not on file  Other Topics Concern   Not on file  Social History Narrative   Likes with her husband. She enjoys doing crafts and refurnishing furniture.   Social Drivers of Corporate investment banker Strain: Low Risk  (09/07/2023)   Overall Financial Resource Strain (CARDIA)    Difficulty of Paying Living Expenses: Not hard at all  Food Insecurity: No Food Insecurity (09/07/2023)   Hunger Vital Sign    Worried About Running Out of Food in the Last Year: Never true    Ran Out of Food in the Last Year: Never true  Transportation Needs: No Transportation  Needs (09/07/2023)   PRAPARE - Administrator, Civil Service (Medical): No    Lack of Transportation (Non-Medical): No  Physical Activity: Insufficiently Active (09/07/2023)   Exercise Vital Sign    Days of Exercise per Week: 3 days    Minutes of Exercise per Session: 20 min  Stress: No Stress Concern Present (09/07/2023)   [employer] of Occupational Health - Occupational Stress Questionnaire    Feeling of Stress : Only a little  Social Connections: Moderately Isolated (09/07/2023)   Social Connection and Isolation Panel [NHANES]    Frequency of Communication with Friends and Family: Twice a week    Frequency of Social Gatherings with Friends and Family: Once a week    Attends Religious Services: Never    Database administrator or Organizations: No    Attends Engineer, structural: Not on file    Marital Status: Married     Family History: The patient's family history includes Cancer in her maternal grandfather, mother, and sister; Colon cancer in her maternal grandfather; Esophageal cancer in her sister; Hyperlipidemia in her father and mother; Hypertension in her father and mother; Stroke in her father.  ROS:   Please see the history of present illness.    Review of Systems  Constitutional:  Negative for chills and fever.  HENT:  Negative for sore throat.   Respiratory:  Negative for shortness of breath.   Cardiovascular:  Positive for palpitations. Negative for chest pain, orthopnea, claudication, leg swelling and PND.  Gastrointestinal:  Negative for nausea and vomiting.  Musculoskeletal:  Negative for falls.  Neurological:  Negative for dizziness, sensory change, speech change, focal weakness, loss of consciousness and weakness.     EKGs/Labs/Other Studies Reviewed:    The following studies were reviewed today: MRI/MRA head and neck 11/2020:   IMPRESSION: 1. No acute intracranial finding. Chronic small-vessel ischemic changes of the dorsal  pons. 2. Negative MR angiography of the neck vessels, though the proximal vertebral arteries are not well seen due to motion, particularly on the right. I cannot rule out proximal right vertebral stenosis, but do not strongly suspect that. 3. Intracranial MR angiography of the large and medium size vessels is normal.  CTA Chest/Abd/Pelvis 2018: FINDINGS: CTA CHEST FINDINGS   Cardiovascular: Satisfactory opacification of the pulmonary arteries to the segmental level. No evidence of pulmonary embolism. Normal heart size. No pericardial effusion. There is no evidence of thoracic aortic dissection or aneurysm.   Mediastinum/Nodes: Possible 2 cm right thyroid mass is noted. No adenopathy is noted.  The esophagus is unremarkable.   Lungs/Pleura: Lungs are clear. No pleural effusion or pneumothorax.   Musculoskeletal: No chest wall abnormality. No acute or significant osseous findings.   Review of the MIP images confirms the above findings.   CT ABDOMEN and PELVIS FINDINGS   Hepatobiliary: No gallstones are noted. 46 x 28 mm multilobulated cyst is seen superiorly in right hepatic lobe.   Pancreas: Unremarkable. No pancreatic ductal dilatation or surrounding inflammatory changes.   Spleen: Moderate splenomegaly is noted without focal abnormality.   Adrenals/Urinary Tract: Adrenal glands are unremarkable. Kidneys are normal, without renal calculi, focal lesion, or hydronephrosis. Bladder is unremarkable.   Stomach/Bowel: Stomach is within normal limits. Appendix appears normal. No evidence of bowel wall thickening, distention, or inflammatory changes.   Vascular/Lymphatic: No significant vascular findings are present. No enlarged abdominal or pelvic lymph nodes.   Reproductive: Uterus and bilateral adnexa are unremarkable.   Other: No abdominal wall hernia or abnormality. No abdominopelvic ascites.   Musculoskeletal: No acute or significant osseous findings.   Review of  the MIP images confirms the above findings.   IMPRESSION: No definite evidence of pulmonary embolus.   Possible 2 cm right thyroid mass. Thyroid ultrasound is recommended for further evaluation.   4.6 x 2.8 cm probable benign multilobulated cyst seen in right hepatic lobe.   No other abnormality seen in the chest, abdomen or pelvis.  EKG Interpretation Date/Time:  Tuesday December 01 2023 14:10:52 EST Ventricular Rate:  77 PR Interval:  156 QRS Duration:  82 QT Interval:  370 QTC Calculation: 418 R Axis:   60  Text Interpretation: Normal sinus rhythm Normal ECG No previous ECGs available Confirmed by Armanda Magic [phone]) on 12/01/2023 2:21:51 PM    Recent Labs: 07/03/2023: ALT 20; Hemoglobin 13.8; Platelets 196; TSH 1.500 11/05/2023: BUN 27; Creatinine, Ser 1.20; Potassium 4.2; Sodium 136  Recent Lipid Panel    Component Value Date/Time   CHOL 107 07/03/2023 1057   TRIG 103 07/03/2023 1057   HDL 40 07/03/2023 1057   CHOLHDL 2.7 07/03/2023 1057   CHOLHDL 3.1 07/29/2022 0000   VLDL 26 07/07/2017 1341   LDLCALC 48 07/03/2023 1057   LDLCALC 55 07/29/2022 0000     Risk Assessment/Calculations:           Physical Exam:    VS:  BP 90/72 (BP Location: Left Arm, Patient Position: Sitting, Cuff Size: Normal)   Pulse 77   Ht 5\' 5"  (1.651 m)   Wt 205 lb 6.4 oz (93.2 kg)   SpO2 98%   BMI 34.18 kg/m     Wt Readings from Last 3 Encounters:  12/01/23 205 lb 6.4 oz (93.2 kg)  11/09/23 202 lb 1.9 oz (91.7 kg)  07/07/23 223 lb 1.9 oz (101.2 kg)     GEN:  Well nourished, well developed in no acute distress HEENT: Normal NECK: No JVD; No carotid bruits CARDIAC: RRR, no murmurs, rubs, gallops RESPIRATORY:  Clear to auscultation without rales, wheezing or rhonchi  ABDOMEN: Soft, non-tender, non-distended MUSCULOSKELETAL:  No edema; No deformity  SKIN: Warm and dry NEUROLOGIC:  Alert and oriented x 3 PSYCHIATRIC:  Normal affect   ASSESSMENT:    1. TIA (transient  ischemic attack)   2. Essential hypertension   3. Pure hypercholesterolemia     PLAN:    In order of problems listed above:  #Concern for TIA: -Patient reported episode of transient loss of memory in 11/2020 but this was in setting of her being asleep and  her husband awakened her and she appeared confused -Had MRI/MRA head with no acute findings.  -No known history of CAD, HF or arrhythmias.  -2-week Zio patch 12/22 showed 1 run of NSVT lasting 8 beats and 27 runs of SVT with the longest lasting 17 beats in a row with rare PVCs and PACs and not felt to cause a TIA -2D echo 10/2021 showed EF 65 to 70%, G1 DD, normal LV strain, mild RVE with normal RV function -Continue prescription drug management Toprol XL 25 mg daily  #HTN: -BP controlled on exam today -Continue drug management with Toprol-XL 25 mg daily with as needed refills -decrease Losartan to 25mg  daily due to soft BP and dizziness -if dizziness continues she will let me know -repeat BMET in 1 week  #HLD: -LDL goal less than 100  -Coronary calcium score 10/2021 was 0  -I have personally reviewed and interpreted outside labs performed by patient's PCP which showed LDL 48 and HDL 40 on 07/03/2023 and ALT 20 -she would like to get off the statin if possible>>I will drop her down to 10mg  daily and repeat FLP and ALT in 8 weeks  #OSA -she had a HST in 2024 showing mild OSA with an AHI of 11/hr -she has not seen a sleep specialist since then -since her last sleep study she has lost 30lbs and I think we should repeat the HST prior to referring for oral device -She is on Mounjaro and has lost over 60lbs on it over the past few years and now that the FDA has approved it for patients with OSA I will see if our PharmD can get insurance to approve  Followup with me in 1 year    Medication Adjustments/Labs and Tests Ordered: Current medicines are reviewed at length with the patient today.  Concerns regarding medicines are outlined  above.  Orders Placed This Encounter  Procedures   EKG 12-Lead   No orders of the defined types were placed in this encounter.    There are no Patient Instructions on file for this visit.   Signed, Armanda Magic, MD  12/01/2023 2:27 PM    [facility]

## 2023-12-01 NOTE — Telephone Encounter (Signed)
DR. Derl Barrow STUDY  Patient agreement reviewed and signed on 12/01/2023.  WatchPAT issued to patient on 12/01/2023 by Danielle Rankin, CMA. Patient aware to not open the WatchPAT box until contacted with the activation PIN. Patient profile initialized in CloudPAT on 12/01/2023 by Danielle Rankin, CMA. Device serial number: [id number]  Please list Reason for Call as Advice Only and type "WatchPAT issued to patient" in the comment box.

## 2023-12-09 NOTE — Telephone Encounter (Signed)
 Ordering provider: Turner Associated diagnoses: OSA WatchPAT PA obtained on 12/09/2023 by Maceo Sax, LPN. Authorization: Yes; tracking ID [id number] Patient notified of PIN (1234) on 12/09/2023 via Notification Method: phone.

## 2023-12-15 ENCOUNTER — Encounter: Payer: Self-pay | Admitting: Medical-Surgical

## 2023-12-15 LAB — BASIC METABOLIC PANEL
BUN/Creatinine Ratio: 11 — ABNORMAL LOW (ref 12–28)
BUN: 10 mg/dL (ref 8–27)
CO2: 23 mmol/L (ref 20–29)
Calcium: 9.5 mg/dL (ref 8.7–10.3)
Chloride: 96 mmol/L (ref 96–106)
Creatinine, Ser: 0.87 mg/dL (ref 0.57–1.00)
Glucose: 86 mg/dL (ref 70–99)
Potassium: 4.6 mmol/L (ref 3.5–5.2)
Sodium: 132 mmol/L — ABNORMAL LOW (ref 134–144)
eGFR: 72 mL/min/{1.73_m2} (ref >59)

## 2023-12-31 ENCOUNTER — Ambulatory Visit: Payer: Medicare Other | Admitting: Medical-Surgical

## 2024-01-05 ENCOUNTER — Ambulatory Visit: Payer: Medicare Other | Admitting: Medical-Surgical

## 2024-01-07 ENCOUNTER — Other Ambulatory Visit: Payer: Self-pay | Admitting: Medical-Surgical

## 2024-01-07 DIAGNOSIS — I10 Essential (primary) hypertension: Secondary | ICD-10-CM

## 2024-01-09 LAB — LIPID PANEL
Chol/HDL Ratio: 2.8 ratio (ref 0.0–4.4)
Cholesterol, Total: 108 mg/dL (ref 100–199)
HDL: 39 mg/dL — ABNORMAL LOW (ref >39)
LDL Chol Calc (NIH): 47 mg/dL (ref 0–99)
Triglycerides: 122 mg/dL (ref 0–149)
VLDL Cholesterol Cal: 22 mg/dL (ref 5–40)

## 2024-01-09 LAB — ALT: ALT: 18 IU/L (ref 0–32)

## 2024-01-20 ENCOUNTER — Telehealth: Payer: Self-pay | Admitting: Pharmacy Technician

## 2024-01-20 ENCOUNTER — Other Ambulatory Visit (HOSPITAL_COMMUNITY): Payer: Self-pay

## 2024-01-20 NOTE — Telephone Encounter (Signed)
 Pharmacy Patient Advocate Encounter   Received notification from Physician's Office that prior authorization for zepbound is required/requested.   Insurance verification completed.   The patient is insured through  [employer]  .   Per test claim: PA required; PA submitted to above mentioned insurance via CoverMyMeds Key/confirmation #/EOC [id number] Status is pending

## 2024-01-20 NOTE — Telephone Encounter (Signed)
 Pharmacy Patient Advocate Encounter  Received notification from  [employer]  that Prior Authorization for zepbound has been DENIED.  Full denial letter will be uploaded to the media tab. See denial reason below.   PA #/Case ID/Reference #: [id number]

## 2024-02-15 ENCOUNTER — Encounter (INDEPENDENT_AMBULATORY_CARE_PROVIDER_SITE_OTHER): Payer: Self-pay | Admitting: Cardiology

## 2024-02-15 DIAGNOSIS — R0683 Snoring: Secondary | ICD-10-CM

## 2024-02-15 DIAGNOSIS — G4733 Obstructive sleep apnea (adult) (pediatric): Secondary | ICD-10-CM

## 2024-02-22 ENCOUNTER — Ambulatory Visit: Attending: Cardiology

## 2024-02-22 DIAGNOSIS — G4733 Obstructive sleep apnea (adult) (pediatric): Secondary | ICD-10-CM

## 2024-02-22 NOTE — Procedures (Signed)
   SLEEP STUDY REPORT Patient Information Study Date: 02/15/2024 Patient Name: Pamela Newman Patient ID: [id number] Birth Date: February 12, 1956 Age: 68 Gender: Female BMI: 34.2 (W=205 lb, H=5' 5'') Stopbang: 5 Referring Physician: Gaylyn Keas, MD  TEST DESCRIPTION: Home sleep apnea testing was completed using the WatchPat, a Type 1 device, utilizing  peripheral arterial tonometry (PAT), chest movement, actigraphy, pulse oximetry, pulse rate, body position and snore.  AHI was calculated with apnea and hypopnea using valid sleep time as the denominator. RDI includes apneas,  hypopneas, and RERAs. The data acquired and the scoring of sleep and all associated events were performed in  accordance with the recommended standards and specifications as outlined in the AASM Manual for the Scoring of  Sleep and Associated Events 2.2.0 (2015).   FINDINGS: 1. No evidence of Obstructive Sleep Apnea with AHI 1.9/hr.  2. No Central Sleep Apnea. 3. Oxygen desaturations as low as 83%. 4. Mild to moderate snoring was present. O2 sats were < 88% for 0.9 minutes. 5. Total sleep time was 7 hrs and 20 min. 6. 28.9% of total sleep time was spent in REM sleep.  7. Normal sleep onset latency at 17 min.  8. Shortened REM sleep onset latency at 75 min.  9. Total awakenings were 6.   DIAGNOSIS:  Normal study with no significant sleep disordered breathing.  RECOMMENDATIONS: 1. Normal study with no significant sleep disordered breathing. 2. Healthy sleep recommendations include: adequate nightly sleep (normal 7-9 hrs/night), avoidance of caffeine after  noon and alcohol near bedtime, and maintaining a sleep environment that is cool, dark and quiet. 3. Weight loss for overweight patients is recommended.  4. Snoring recommendations include: weight loss where appropriate, side sleeping, and avoidance of alcohol before  bed. 5. Operation of motor vehicle or dangerous equipment must be avoided when feeling drowsy,  excessively sleepy, or  mentally fatigued.  6. An ENT consultation which may be useful for specific causes of and possible treatment of bothersome snoring .  7. Weight loss may be of benefit in reducing the severity of snoring.   Signature: Gaylyn Keas, MD; [facility]; Diplomat, American Board of Sleep  Medicine Electronically Signed: 02/22/2024 10:09:29 AM

## 2024-02-24 ENCOUNTER — Telehealth: Payer: Self-pay | Admitting: *Deleted

## 2024-02-24 NOTE — Telephone Encounter (Signed)
 The patient has been notified of the result and verbalized understanding.  All questions (if any) were answered. Gaylene Kays, CMA 02/24/2024 10:16 AM    Pt is agreeable to normal results.

## 2024-02-24 NOTE — Telephone Encounter (Signed)
-----   Message from Gaylyn Keas sent at 02/22/2024 10:11 AM EDT ----- Please let patient know that sleep study showed no significant sleep apnea.

## 2024-02-26 ENCOUNTER — Encounter

## 2024-03-02 ENCOUNTER — Other Ambulatory Visit: Payer: Self-pay | Admitting: Medical-Surgical

## 2024-03-02 DIAGNOSIS — I10 Essential (primary) hypertension: Secondary | ICD-10-CM

## 2024-07-05 ENCOUNTER — Encounter: Payer: Self-pay | Admitting: Sports Medicine

## 2024-08-10 ENCOUNTER — Encounter: Payer: Self-pay | Admitting: Medical-Surgical

## 2024-08-10 DIAGNOSIS — E7849 Other hyperlipidemia: Secondary | ICD-10-CM

## 2024-08-10 DIAGNOSIS — Z0000 Encounter for general adult medical examination without abnormal findings: Secondary | ICD-10-CM

## 2024-08-10 DIAGNOSIS — I10 Essential (primary) hypertension: Secondary | ICD-10-CM

## 2024-08-11 ENCOUNTER — Encounter: Payer: Self-pay | Admitting: Cardiology

## 2024-08-11 ENCOUNTER — Other Ambulatory Visit: Payer: Self-pay | Admitting: Medical-Surgical

## 2024-08-11 DIAGNOSIS — Z1231 Encounter for screening mammogram for malignant neoplasm of breast: Secondary | ICD-10-CM

## 2024-08-11 NOTE — Addendum Note (Signed)
 Addended byBETHA WILLO MINI on: 08/11/2024 07:28 PM   Modules accepted: Orders

## 2024-08-18 ENCOUNTER — Telehealth: Payer: Self-pay

## 2024-08-18 MED ORDER — METOPROLOL SUCCINATE ER 50 MG PO TB24
50.0000 mg | ORAL_TABLET | Freq: Every day | ORAL | 3 refills | Status: AC
Start: 2024-08-18 — End: 2024-11-16

## 2024-08-18 NOTE — Telephone Encounter (Signed)
 50 mg Toprol  reordered per patient request, MC to patient.

## 2024-08-28 ENCOUNTER — Encounter: Payer: Self-pay | Admitting: Medical-Surgical

## 2024-09-06 ENCOUNTER — Ambulatory Visit: Payer: Self-pay | Admitting: Medical-Surgical

## 2024-09-06 LAB — CMP14+EGFR
ALT: 9 IU/L (ref 0–32)
AST: 17 IU/L (ref 0–40)
Albumin: 4.5 g/dL (ref 3.9–4.9)
Alkaline Phosphatase: 67 IU/L (ref 49–135)
BUN/Creatinine Ratio: 12 (ref 12–28)
BUN: 13 mg/dL (ref 8–27)
Bilirubin Total: 0.4 mg/dL (ref 0.0–1.2)
CO2: 23 mmol/L (ref 20–29)
Calcium: 9.8 mg/dL (ref 8.7–10.3)
Chloride: 101 mmol/L (ref 96–106)
Creatinine, Ser: 1.05 mg/dL — ABNORMAL HIGH (ref 0.57–1.00)
Globulin, Total: 2.3 g/dL (ref 1.5–4.5)
Glucose: 78 mg/dL (ref 70–99)
Potassium: 4.9 mmol/L (ref 3.5–5.2)
Sodium: 137 mmol/L (ref 134–144)
Total Protein: 6.8 g/dL (ref 6.0–8.5)
eGFR: 57 mL/min/1.73 — ABNORMAL LOW (ref >59)

## 2024-09-06 LAB — CBC
Hematocrit: 46.1 % (ref 34.0–46.6)
Hemoglobin: 14.8 g/dL (ref 11.1–15.9)
MCH: 30.3 pg (ref 26.6–33.0)
MCHC: 32.1 g/dL (ref 31.5–35.7)
MCV: 94 fL (ref 79–97)
Platelets: 237 x10E3/uL (ref 150–450)
RBC: 4.89 x10E6/uL (ref 3.77–5.28)
RDW: 13.3 % (ref 11.7–15.4)
WBC: 6.1 x10E3/uL (ref 3.4–10.8)

## 2024-09-06 LAB — LIPID PANEL
Chol/HDL Ratio: 5.5 ratio — ABNORMAL HIGH (ref 0.0–4.4)
Cholesterol, Total: 225 mg/dL — ABNORMAL HIGH (ref 100–199)
HDL: 41 mg/dL (ref >39)
LDL Chol Calc (NIH): 149 mg/dL — ABNORMAL HIGH (ref 0–99)
Triglycerides: 190 mg/dL — ABNORMAL HIGH (ref 0–149)
VLDL Cholesterol Cal: 35 mg/dL (ref 5–40)

## 2024-09-08 ENCOUNTER — Ambulatory Visit

## 2024-09-12 ENCOUNTER — Encounter: Payer: Self-pay | Admitting: Cardiology

## 2024-09-12 ENCOUNTER — Telehealth: Payer: Self-pay | Admitting: Cardiology

## 2024-09-12 ENCOUNTER — Ambulatory Visit (INDEPENDENT_AMBULATORY_CARE_PROVIDER_SITE_OTHER): Admitting: Medical-Surgical

## 2024-09-12 ENCOUNTER — Encounter: Payer: Self-pay | Admitting: Medical-Surgical

## 2024-09-12 VITALS — BP 120/75 | HR 69 | Resp 20 | Ht 65.0 in | Wt 184.0 lb

## 2024-09-12 DIAGNOSIS — Z0000 Encounter for general adult medical examination without abnormal findings: Secondary | ICD-10-CM

## 2024-09-12 DIAGNOSIS — I10 Essential (primary) hypertension: Secondary | ICD-10-CM

## 2024-09-12 DIAGNOSIS — E7849 Other hyperlipidemia: Secondary | ICD-10-CM

## 2024-09-12 DIAGNOSIS — N289 Disorder of kidney and ureter, unspecified: Secondary | ICD-10-CM

## 2024-09-12 NOTE — Telephone Encounter (Signed)
 Error

## 2024-09-12 NOTE — Progress Notes (Signed)
 Complete physical exam  Patient: Pamela Newman   DOB: Feb 23, 1956   68 y.o. Female  MRN: [id number]  Subjective:    Chief Complaint  Patient presents with   Annual Exam    Pamela Newman is a 68 y.o. female who presents today for a complete physical exam. She reports consuming a general diet. Walking for exercise. She generally feels well. She reports sleeping well. She does not have additional problems to discuss today.    Most recent fall risk assessment:    09/12/2024    3:04 PM  Fall Risk   Falls in the past year? 1  Number falls in past yr: 0  Injury with Fall? 0  Risk for fall due to : History of fall(s)  Follow up Falls evaluation completed     Most recent depression screenings:    09/12/2024    3:04 PM 09/07/2023    1:40 PM  PHQ 2/9 Scores  PHQ - 2 Score 2 0  PHQ- 9 Score 4     Vision:Within last year and Dental: No current dental problems and Receives regular dental care    Patient Care Team: Willo Mini, NP as PCP - General (Nurse Practitioner)   Outpatient Medications Prior to Visit  Medication Sig   acetaminophen (TYLENOL) 650 MG CR tablet Take 650 mg by mouth every 8 (eight) hours as needed.   Ascorbic Acid (VITAMIN C) 1000 MG tablet Take 1,000 mg by mouth daily.   Calcium  Carbonate (CALTRATE 600 PO) Take by mouth.   Lactobacillus (PROBIOTIC ACIDOPHILUS PO) Take by mouth.   lactobacillus acidophilus (BACID) TABS tablet Take 2 tablets by mouth 3 (three) times daily.   losartan  (COZAAR ) 25 MG tablet Take 1 tablet (25 mg total) by mouth daily.   metoprolol  succinate (TOPROL -XL) 50 MG 24 hr tablet Take 1 tablet (50 mg total) by mouth daily. Take with or immediately following a meal.   Psyllium (METAMUCIL PO) Take by mouth.   tirzepatide  (MOUNJARO ) 7.5 MG/0.5ML Pen Inject 7.5 mg into the skin once a week. (Patient taking differently: Inject 10 mg into the skin once a week.)   Vitamin D -Vitamin K (D3 + K2 PO) Take 1 capsule by mouth daily at 6 (six) AM.    [DISCONTINUED] atorvastatin  (LIPITOR) 10 MG tablet Take 1 tablet (10 mg total) by mouth daily.   No facility-administered medications prior to visit.   Review of Systems  Constitutional:  Negative for chills, fever, malaise/fatigue and weight loss.  HENT:  Negative for congestion, ear pain, hearing loss, sinus pain and sore throat.   Eyes:  Negative for blurred vision, photophobia and pain.  Respiratory:  Negative for cough, shortness of breath and wheezing.   Cardiovascular:  Negative for chest pain, palpitations and leg swelling.  Gastrointestinal:  Negative for abdominal pain, constipation, diarrhea, heartburn, nausea and vomiting.  Genitourinary:  Negative for dysuria, frequency and urgency.  Musculoskeletal:  Negative for falls and neck pain.  Skin:  Negative for itching and rash.  Neurological:  Negative for dizziness, weakness and headaches.  Endo/Heme/Allergies:  Negative for polydipsia. Does not bruise/bleed easily.  Psychiatric/Behavioral:  Negative for depression, substance abuse and suicidal ideas. The patient is not nervous/anxious and does not have insomnia.       Objective:    BP 120/75 (BP Location: Left Arm, Cuff Size: Normal)   Pulse 69   Resp 20   Ht 5' 5 (1.651 m)   Wt 184 lb (83.5 kg)   SpO2 100%  BMI 30.62 kg/m    Physical Exam Constitutional:      General: She is not in acute distress.    Appearance: Normal appearance. She is not ill-appearing.  HENT:     Head: Normocephalic and atraumatic.     Right Ear: Tympanic membrane, ear canal and external ear normal. There is no impacted cerumen.     Left Ear: Tympanic membrane, ear canal and external ear normal. There is no impacted cerumen.     Nose: Nose normal. No congestion or rhinorrhea.     Mouth/Throat:     Mouth: Mucous membranes are moist.     Pharynx: No oropharyngeal exudate or posterior oropharyngeal erythema.  Eyes:     General: No scleral icterus.       Right eye: No discharge.        Left  eye: No discharge.     Extraocular Movements: Extraocular movements intact.     Conjunctiva/sclera: Conjunctivae normal.     Pupils: Pupils are equal, round, and reactive to light.  Neck:     Thyroid : No thyromegaly.     Vascular: No carotid bruit or JVD.     Trachea: Trachea normal.  Cardiovascular:     Rate and Rhythm: Normal rate and regular rhythm.     Pulses: Normal pulses.     Heart sounds: Normal heart sounds. No murmur heard.    No friction rub. No gallop.  Pulmonary:     Effort: Pulmonary effort is normal. No respiratory distress.     Breath sounds: Normal breath sounds. No wheezing.  Abdominal:     General: Bowel sounds are normal. There is no distension.     Palpations: Abdomen is soft.     Tenderness: There is no abdominal tenderness. There is no guarding.  Musculoskeletal:        General: Normal range of motion.     Cervical back: Normal range of motion and neck supple.  Lymphadenopathy:     Cervical: No cervical adenopathy.  Skin:    General: Skin is warm and dry.  Neurological:     Mental Status: She is alert and oriented to person, place, and time.     Cranial Nerves: No cranial nerve deficit.  Psychiatric:        Mood and Affect: Mood normal.        Behavior: Behavior normal.        Thought Content: Thought content normal.        Judgment: Judgment normal.    No results found for any visits on 09/12/24.     Assessment & Plan:    Routine Health Maintenance and Physical Exam  Immunization History  Administered Date(s) Administered   INFLUENZA, HIGH DOSE SEASONAL PF 01/19/2019, 11/11/2019   Influenza,inj,Quad PF,6+ Mos 07/09/2017   Influenza-Unspecified 11/01/2007, 10/11/2015, 01/19/2019, 11/11/2019   Td 11/03/1998   Tdap 11/18/2012   Zoster Recombinant(Shingrix ) 07/09/2017   Zoster, Live 05/23/2014    Health Maintenance  Topic Date Due   Pneumococcal Vaccine: 50+ Years (1 of 1 - PCV) Never done   Zoster Vaccines- Shingrix  (2 of 2) 09/03/2017    DTaP/Tdap/Td (3 - Td or Tdap) 11/18/2022   Influenza Vaccine  06/03/2024   Medicare Annual Wellness (AWV)  09/06/2024   Mammogram  04/29/2025   DEXA SCAN  10/23/2026   Hepatitis C Screening  Completed   Meningococcal B Vaccine  Aged Out   Colonoscopy  Discontinued   COVID-19 Vaccine  Discontinued    Discussed health benefits  of physical activity, and encouraged her to engage in regular exercise appropriate for her age and condition.  1. Annual physical exam (Primary) Reviewed lab results from recent blood draw. Recommend updating vision and dental care. Wellness information included with AVS.   2. Abnormal kidney function Discussed findings, likely related to dehydration and Celebrex  use. Plan to recheck BMET in 2-4 weeks. Make sure to hydrate and avoid NSAIDs.  - Basic Metabolic Panel (BMET)  3. Essential hypertension Well controlled. Followed/managed by cardiology.   4. Other hyperlipidemia Stopped atorvastatin  with notable improvement in joint aches. Added to intolerance list. Patient will discuss further with Cardiology regarding a different medication vs dietary modifications.   Return in about 1 year (around 09/12/2025) for annual physical exam or sooner if needed.     Brigg Cape, NP

## 2024-09-12 NOTE — Telephone Encounter (Signed)
 Just FYI--Patient wanted an appointment to discuss medication changes after having palpitations. She scheduled for 11/19 with Dr. Shlomo, but does not want a call back to discuss.

## 2024-09-12 NOTE — Patient Instructions (Signed)
 Preventive Care 83 Years and Older, Female Preventive care refers to lifestyle choices and visits with your health care provider that can promote health and wellness. Preventive care visits are also called wellness exams. What can I expect for my preventive care visit? Counseling Your health care provider may ask you questions about your: Medical history, including: Past medical problems. Family medical history. Pregnancy and menstrual history. History of falls. Current health, including: Memory and ability to understand (cognition). Emotional well-being. Home life and relationship well-being. Sexual activity and sexual health. Lifestyle, including: Alcohol, nicotine or tobacco, and drug use. Access to firearms. Diet, exercise, and sleep habits. Work and work Astronomer. Sunscreen use. Safety issues such as seatbelt and bike helmet use. Physical exam Your health care provider will check your: Height and weight. These may be used to calculate your BMI (body mass index). BMI is a measurement that tells if you are at a healthy weight. Waist circumference. This measures the distance around your waistline. This measurement also tells if you are at a healthy weight and may help predict your risk of certain diseases, such as type 2 diabetes and high blood pressure. Heart rate and blood pressure. Body temperature. Skin for abnormal spots. What immunizations do I need?  Vaccines are usually given at various ages, according to a schedule. Your health care provider will recommend vaccines for you based on your age, medical history, and lifestyle or other factors, such as travel or where you work. What tests do I need? Screening Your health care provider may recommend screening tests for certain conditions. This may include: Lipid and cholesterol levels. Hepatitis C test. Hepatitis B test. HIV (human immunodeficiency virus) test. STI (sexually transmitted infection) testing, if you are at  risk. Lung cancer screening. Colorectal cancer screening. Diabetes screening. This is done by checking your blood sugar (glucose) after you have not eaten for a while (fasting). Mammogram. Talk with your health care provider about how often you should have regular mammograms. BRCA-related cancer screening. This may be done if you have a family history of breast, ovarian, tubal, or peritoneal cancers. Bone density scan. This is done to screen for osteoporosis. Talk with your health care provider about your test results, treatment options, and if necessary, the need for more tests. Follow these instructions at home: Eating and drinking  Eat a diet that includes fresh fruits and vegetables, whole grains, lean protein, and low-fat dairy products. Limit your intake of foods with high amounts of sugar, saturated fats, and salt. Take vitamin and mineral supplements as recommended by your health care provider. Do not drink alcohol if your health care provider tells you not to drink. If you drink alcohol: Limit how much you have to 0-1 drink a day. Know how much alcohol is in your drink. In the [place] one drink equals one 12 oz bottle of beer (355 mL), one 5 oz glass of wine (148 mL), or one 1 oz glass of hard liquor (44 mL). Lifestyle Brush your teeth every morning and night with fluoride toothpaste. Floss one time each day. Exercise for at least 30 minutes 5 or more days each week. Do not use any products that contain nicotine or tobacco. These products include cigarettes, chewing tobacco, and vaping devices, such as e-cigarettes. If you need help quitting, ask your health care provider. Do not use drugs. If you are sexually active, practice safe sex. Use a condom or other form of protection in order to prevent STIs. Take aspirin only as told by  your health care provider. Make sure that you understand how much to take and what form to take. Work with your health care provider to find out whether it  is safe and beneficial for you to take aspirin daily. Ask your health care provider if you need to take a cholesterol-lowering medicine (statin). Find healthy ways to manage stress, such as: Meditation, yoga, or listening to music. Journaling. Talking to a trusted person. Spending time with friends and family. Minimize exposure to UV radiation to reduce your risk of skin cancer. Safety Always wear your seat belt while driving or riding in a vehicle. Do not drive: If you have been drinking alcohol. Do not ride with someone who has been drinking. When you are tired or distracted. While texting. If you have been using any mind-altering substances or drugs. Wear a helmet and other protective equipment during sports activities. If you have firearms in your house, make sure you follow all gun safety procedures. What's next? Visit your health care provider once a year for an annual wellness visit. Ask your health care provider how often you should have your eyes and teeth checked. Stay up to date on all vaccines. This information is not intended to replace advice given to you by your health care provider. Make sure you discuss any questions you have with your health care provider. Document Revised: 04/17/2021 Document Reviewed: 04/17/2021 Elsevier Patient Education  2024 [employer].

## 2024-09-20 ENCOUNTER — Ambulatory Visit

## 2024-09-20 DIAGNOSIS — Z1231 Encounter for screening mammogram for malignant neoplasm of breast: Secondary | ICD-10-CM | POA: Diagnosis not present

## 2024-09-21 ENCOUNTER — Ambulatory Visit: Admitting: Cardiology

## 2024-09-23 ENCOUNTER — Ambulatory Visit: Payer: Self-pay | Admitting: Medical-Surgical

## 2024-10-03 ENCOUNTER — Telehealth: Payer: Self-pay | Admitting: Cardiology

## 2024-10-03 ENCOUNTER — Ambulatory Visit: Attending: Cardiology

## 2024-10-03 DIAGNOSIS — R002 Palpitations: Secondary | ICD-10-CM

## 2024-10-03 DIAGNOSIS — Z789 Other specified health status: Secondary | ICD-10-CM

## 2024-10-03 DIAGNOSIS — E7800 Pure hypercholesterolemia, unspecified: Secondary | ICD-10-CM

## 2024-10-03 NOTE — Telephone Encounter (Signed)
 Spoke with patient about plan per MD. She is agreeable. Referral ordered. Monitor ordered.

## 2024-10-03 NOTE — Telephone Encounter (Signed)
 Spoke with patient of Dr. Shlomo. Metoprolol  succinate was increased to 50mg  08/18/24. She does not feel that dose increase helped symptoms. She reports palps/flutters occur mostly at night, feels like a  muscle contraction; location: left chest area.  Drinks mostly water. 16-20oz coffee in AM  HR - 70s BP 110-120s/70s  Recent labs looked good - electrolytes, no anemia  Last TSH 06/2023  She reports body aches on statin - she stopped on her own. Aches/pains improved. Sleep improved. LDL was 47 >> 149.   Advised will send to MD to review/advise. Next visit is 01/2025

## 2024-10-03 NOTE — Progress Notes (Unsigned)
 Enrolled for [employer] to mail a ZIO XT long term holter monitor to the patients address on file.

## 2024-10-03 NOTE — Telephone Encounter (Signed)
 Pt c/o medication issue:  1. Name of Medication: metoprolol  succinate (TOPROL -XL) 50 MG 24 hr tablet   2. How are you currently taking this medication (dosage and times per day)? As written  3. Are you having a reaction (difficulty breathing--STAT)? No  4. What is your medication issue? Pt states this medication isn't helping her after the increase and would like to know what she should do next.

## 2024-10-03 NOTE — Telephone Encounter (Signed)
 Shlomo Wilbert SAUNDERS, MD to [facility] Triage  Janit Geni CROME, RN (Selected Message)    10/03/24  4:29 PM Refer to lipid clinic for statin intolerance.  Order a 2 week ziopatch for palpitations

## 2024-11-01 ENCOUNTER — Ambulatory Visit: Payer: Self-pay | Admitting: Cardiology

## 2024-11-01 DIAGNOSIS — R002 Palpitations: Secondary | ICD-10-CM

## 2024-11-01 DIAGNOSIS — I471 Supraventricular tachycardia, unspecified: Secondary | ICD-10-CM

## 2024-11-26 ENCOUNTER — Telehealth: Payer: Self-pay | Admitting: Cardiology

## 2024-11-29 ENCOUNTER — Ambulatory Visit: Admitting: Cardiology

## 2024-11-29 NOTE — Progress Notes (Signed)
 Patient ID: Pamela Newman                 DOB: 14-Jun-1956                    MRN: [id number]      HPI: Pamela Newman is a 69 y.o. female patient referred to lipid clinic by Dr. Shlomo. PMH is significant for HTN, HLD, MDD, obesity, 0 CAC Score in 2022, possible TIA.  Last visit with Dr. Shlomo on 12/01/23, the patient expressed interest in stopping her statin and she was decreased to 10 mg from 20 mg. Last lipid panel on 09/05/24 showed a TC of 225, TG 190, and LDL-C of 149 (increased from 47 prior). After following up with the patient with the results, she reported muscle pains with statin therapy and self-discontinuing her medication. She was then referred to lipid clinic.   At today's visit, she reports muscle aches and pains with atorvastatin , which resolved upon discontinuation. When she tried to restart at a lower dose, she had the muscle side effects start again. She is hesitant to start statin therapy again due to side effects and concerns for cognitive impairment. Reviewed options for lowering LDL cholesterol, including ezetimibe, PCSK-9 inhibitors, bempedoic acid.  Discussed mechanisms of action, dosing, side effects and potential decreases in LDL cholesterol.  Also reviewed cost information and potential options for patient assistance.   At this time, she prefers to move forward with a PCSK9 inhibitor to see if her insurance will cover it. Will follow through with the PA process. Extensively discussed lifestyle changes, including weight bearing and aerobic exercise, as well as healthy dietary choices (avoiding fatty, fried, or processed foods).   Current Medications: none Intolerances: muscle aches on atorvastatin  Risk Factors: >65YO, HTN, +family history LDL-C goal: <100 mg/dL   Diet: does not adhere to a specific diet, actively working on dietary changes  Exercise: light resistance exercises and works out at [employer] when able  Family History:  Family History  Problem Relation Age  of Onset   Cancer Mother        BREAST   Hypertension Mother    Hyperlipidemia Mother    Hypertension Father    Hyperlipidemia Father    Stroke Father    Esophageal cancer Sister    Cancer Sister        LUNG   Colon cancer Maternal Grandfather    Cancer Maternal Grandfather        COLON    Social History:  Tobacco: never   Labs: Lipid Panel     Component Value Date/Time   CHOL 225 (H) 09/05/2024 1059   TRIG 190 (H) 09/05/2024 1059   HDL 41 09/05/2024 1059   CHOLHDL 5.5 (H) 09/05/2024 1059   CHOLHDL 3.1 07/29/2022 0000   VLDL 26 07/07/2017 1341   LDLCALC 149 (H) 09/05/2024 1059   LDLCALC 55 07/29/2022 0000   LABVLDL 35 09/05/2024 1059   Past Medical History:  Diagnosis Date   Allergy    Anxiety    Arthritis    Asthma    Cataract    Essential hypertension 11/21/2015   Heart disease    SMALL PROLAPSE   Hypertension    Memory loss    january 2022, 10 minutes memory loss, all tests normal and no incidence since   MVP (mitral valve prolapse)    Sleep apnea 06/2023   Medications Ordered Prior to Encounter[1]  Allergies[2]  Assessment/Plan:  1. Hyperlipidemia -  Hyperlipidemia  Assessment:  LDL not at goal of <100 mg/dL. Currently not on any lipid-lowering therapies due to previous statin intolerance.  Discussed alternative agents, including potential hydrophilic statins (rosuvastatin and pravastatin). She has some hesitancy about restarting statins, as she is worried about the cognitive side effects. Extensively discussed literature of cognitive impairment with statin therapy and explained that there is no evidence to directly prove that statins do or do not cause this side effect. She prefers to follow through with the prior authorization for a PCSK9i at this time.  Discussed healthy eating habits and exercise. Explained that lifestyle alone will likely not decrease her LDL to the goal.   Plan:  Will route note to the PA team to determine insurance coverage for  Repatha . Will follow-up with the patient once approval status is known.  Encouraged lifestyle changes, decreasing as much fatty, fried, and processed foods, as well as increasing strength training and aerobic exercise.    Thank you, [name], PharmD PGY1 Pharmacy Resident  12/02/2024  Pamela Newman, Pharm.[name], CPP [facility] [address], [place], [place] [address]  Phone: [phone]; Fax: [phone]      [1]  Current Outpatient Medications on File Prior to Visit  Medication Sig Dispense Refill   acetaminophen (TYLENOL) 650 MG CR tablet Take 650 mg by mouth every 8 (eight) hours as needed.     Ascorbic Acid (VITAMIN C) 1000 MG tablet Take 1,000 mg by mouth daily.     Calcium  Carbonate (CALTRATE 600 PO) Take by mouth.     Lactobacillus (PROBIOTIC ACIDOPHILUS PO) Take by mouth.     lactobacillus acidophilus (BACID) TABS tablet Take 2 tablets by mouth 3 (three) times daily.     losartan  (COZAAR ) 25 MG tablet TAKE 1 TABLET (25 MG TOTAL) BY MOUTH DAILY. 90 tablet 3   metoprolol  succinate (TOPROL -XL) 50 MG 24 hr tablet Take 1 tablet (50 mg total) by mouth daily. Take with or immediately following a meal. 90 tablet 3   Psyllium (METAMUCIL PO) Take by mouth.     tirzepatide  (MOUNJARO ) 7.5 MG/0.5ML Pen Inject 7.5 mg into the skin once a week. (Patient taking differently: Inject 10 mg into the skin once a week.)     Vitamin D -Vitamin K (D3 + K2 PO) Take 1 capsule by mouth daily at 6 (six) AM.     No current facility-administered medications on file prior to visit.  [2]  Allergies Allergen Reactions   Atorvastatin  Other (See Comments)    Joint aches, difficulty sleeping

## 2024-12-01 NOTE — Telephone Encounter (Signed)
 Refill sent

## 2024-12-01 NOTE — Telephone Encounter (Signed)
" °*  STAT* If patient is at the pharmacy, call can be transferred to refill team.   1. Which medications need to be refilled? (please list name of each medication and dose if known)   losartan  (COZAAR ) 25 MG tablet (Expired)   2. Would you like to learn more about the convenience, safety, & potential cost savings by using the [facility]?   3. Are you open to using the [facility] (Type [facility]. ).  4. Which pharmacy/location (including street and city if local pharmacy) is medication to be sent to?  [id number] - [place], [place] - [address]   5. Do they need a 30 day or 90 day supply?   90 day   Patient stated she is completely out of this medication.  Patient has appointment scheduled with Dr. Shlomo on 3/25. "

## 2024-12-02 ENCOUNTER — Ambulatory Visit: Attending: Internal Medicine | Admitting: Pharmacist

## 2024-12-02 DIAGNOSIS — E7849 Other hyperlipidemia: Secondary | ICD-10-CM

## 2024-12-02 NOTE — Assessment & Plan Note (Signed)
 Assessment:  LDL not at goal of <100 mg/dL. Currently not on any lipid-lowering therapies due to previous statin intolerance.  Discussed alternative agents, including potential hydrophilic statins (rosuvastatin and pravastatin). She has some hesitancy about restarting statins, as she is worried about the cognitive side effects. Extensively discussed literature of cognitive impairment with statin therapy and explained that there is no evidence to directly prove that statins do or do not cause this side effect. She prefers to follow through with the prior authorization for a PCSK9i at this time.  Discussed healthy eating habits and exercise. Explained that lifestyle alone will likely not decrease her LDL to the goal.   Plan:  Will route note to the PA team to determine insurance coverage for Repatha . Will follow-up with the patient once approval status is known.  Encouraged lifestyle changes, decreasing as much fatty, fried, and processed foods, as well as increasing strength training and aerobic exercise.

## 2024-12-02 NOTE — Patient Instructions (Addendum)
 We will start the authorization for Repatha  and follow-up with you once we hear back.   Alternative therapies for cholesterol management:  More water-soluble statins that do not cross the blood brain barrier are rosuvastatin and pravastatin An oral option that is not a statin is bempedoic acid +/- ezetimibe   Hyperlipidemia Foods high in saturated fat tend to increase LDL (bad) cholesterol the most.  Not all fat is bad fat! Foods higher in unsaturated fat are healthy, like fish, nuts, and avocadoes. Overall, following a diet like the Mediterranean diet can help to improve your cholesterol.  Hypertriglyceridemia Foods high in carbohydrates and sugar, as well as alcohol, can increase your triglycerides. If you are diabetic, poorly controlled blood sugar can also increase your triglycerides. A non-fasting state can affect the triglyceride level in your lab work. Please make sure you are fasting to improve accuracy of this lab test.   Eat more of these Eat less of these  Carbohydrates Fiber-rich whole grains: oats, whole wheat pasta or bread, quinoa, barley, oats and brown rice Aim for  of your plate to be whole grains Men: aim for > 38 grams of fiber per day Women: aim for > 25 grams of fiber per day Refined grains: white bread, rice, or pasta, macaroni and cheese Foods with added sugar Processed foods: desserts like cake, cookies, donuts, muffins, and pastries; microwave meals, chips, French fries  Fruits and vegetables A variety of bright colored fruits and vegetables: spinach, broccoli, tomatoes, carrots, berries,  oranges, apples, bananas, berries, and melon Aim for  of your plate to be fruits/vegetables Canned vegetables Starchy vegetables like potatoes Canned fruit in heavy syrup  Protein Lean meat: skinless chicken or turkey Fish: salmon, trout, tuna, cod, tilapia, flounder, etc Legumes: beans, lentils, chickpeas, tofu, nuts Aim for  of your plate to be protein Red, fatty, or  fried meat Processed foods: deli meat, hot dogs, burgers, pizza, fast food   Dairy, fats and oils Unsaturated fats: fish, nuts, and avocadoes  Low fat or fat free milk or yogurt Olive and canola oil Saturated fats: butter, lard, cream, coconut oil Whole milk and other full fat dairy products like cheese Sugar-sweetened dairy products (many yogurts have added sugar)  Drinks Water: plain or sparkling Sugar free or diet drinks Unsweet tea or coffee Keep added sugar intake to  < 6 teaspoons (24 grams) Regular soda Fruit juice Alcohol   Other ways to adopt a healthy lifestyle:  Exercise:  Exercise: Aim for 150 min of moderate intensity exercise weekly for heart health, and weights twice weekly for bone health. Stay active - any steps are better than no steps!  Sleep: Aim for 7-9 hours of sleep nightly.  Weight: Know what a healthy weight is for you (roughly BMI <25) and aim to maintain this. Unfortunately, this is not the most accurate measure of healthy weight, but it is the simplest measurement to use. A more accurate measurement involves body scanning which measures lean muscle, fat tissue and bony density. We do not have this equipment at [facility].

## 2024-12-06 ENCOUNTER — Other Ambulatory Visit (HOSPITAL_COMMUNITY): Payer: Self-pay

## 2024-12-06 ENCOUNTER — Telehealth: Payer: Self-pay | Admitting: Pharmacy Technician

## 2024-12-06 DIAGNOSIS — E7849 Other hyperlipidemia: Secondary | ICD-10-CM

## 2024-12-06 NOTE — Telephone Encounter (Signed)
 Pharmacy Patient Advocate Encounter   Received notification from CC charts that prior authorization for Repatha  is required/requested.   Insurance verification completed.   The patient is insured through [facility].   Per test claim: The current 12/06/24 day co-pay is, $34.99- one month.  No PA needed at this time. This test claim was processed through [facility]- copay amounts may vary at other pharmacies due to pharmacy/plan contracts, or as the patient moves through the different stages of their insurance plan.

## 2024-12-08 ENCOUNTER — Ambulatory Visit

## 2024-12-08 MED ORDER — REPATHA SURECLICK 140 MG/ML ~~LOC~~ SOAJ: 1.0000 mL | SUBCUTANEOUS | 3 refills | Status: AC

## 2024-12-08 NOTE — Addendum Note (Signed)
 Addended by: Emeril Stille D on: 12/08/2024 12:08 PM   Modules accepted: Orders

## 2024-12-28 ENCOUNTER — Ambulatory Visit: Admitting: Cardiology

## 2025-01-06 ENCOUNTER — Ambulatory Visit: Admitting: Cardiology

## 2025-01-25 ENCOUNTER — Ambulatory Visit: Admitting: Cardiology

## 2025-09-14 ENCOUNTER — Encounter: Admitting: Medical-Surgical
